# Patient Record
Sex: Female | Born: 1935 | Race: White | Hispanic: No | Marital: Single | Smoking: Former smoker
Health system: Southern US, Community
[De-identification: ages and names within clinical notes are randomized; demographics above are authoritative.]

## PROBLEM LIST (undated history)

## (undated) DIAGNOSIS — R51 Headache: Secondary | ICD-10-CM

## (undated) DIAGNOSIS — R011 Cardiac murmur, unspecified: Secondary | ICD-10-CM

## (undated) DIAGNOSIS — R600 Localized edema: Secondary | ICD-10-CM

## (undated) DIAGNOSIS — Z8719 Personal history of other diseases of the digestive system: Secondary | ICD-10-CM

## (undated) DIAGNOSIS — J302 Other seasonal allergic rhinitis: Secondary | ICD-10-CM

## (undated) DIAGNOSIS — F32A Depression, unspecified: Secondary | ICD-10-CM

## (undated) DIAGNOSIS — I10 Essential (primary) hypertension: Secondary | ICD-10-CM

## (undated) DIAGNOSIS — Z9289 Personal history of other medical treatment: Secondary | ICD-10-CM

## (undated) DIAGNOSIS — I350 Nonrheumatic aortic (valve) stenosis: Secondary | ICD-10-CM

## (undated) DIAGNOSIS — F329 Major depressive disorder, single episode, unspecified: Secondary | ICD-10-CM

## (undated) DIAGNOSIS — M1990 Unspecified osteoarthritis, unspecified site: Secondary | ICD-10-CM

## (undated) DIAGNOSIS — T7840XA Allergy, unspecified, initial encounter: Secondary | ICD-10-CM

## (undated) DIAGNOSIS — A159 Respiratory tuberculosis unspecified: Secondary | ICD-10-CM

## (undated) DIAGNOSIS — K219 Gastro-esophageal reflux disease without esophagitis: Secondary | ICD-10-CM

## (undated) HISTORY — PX: EYE SURGERY: SHX253

## (undated) HISTORY — DX: Allergy, unspecified, initial encounter: T78.40XA

## (undated) HISTORY — DX: Nonrheumatic aortic (valve) stenosis: I35.0

## (undated) HISTORY — PX: OTHER SURGICAL HISTORY: SHX169

## (undated) HISTORY — PX: BACK SURGERY: SHX140

---
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
## 1964-12-02 HISTORY — PX: APPENDECTOMY: SHX54

## 1969-12-02 DIAGNOSIS — A159 Respiratory tuberculosis unspecified: Secondary | ICD-10-CM

## 1969-12-02 HISTORY — DX: Respiratory tuberculosis unspecified: A15.9

## 2000-04-15 ENCOUNTER — Emergency Department (HOSPITAL_COMMUNITY): Admission: EM | Admit: 2000-04-15 | Discharge: 2000-04-15 | Payer: Self-pay | Admitting: Emergency Medicine

## 2000-09-23 ENCOUNTER — Emergency Department (HOSPITAL_COMMUNITY): Admission: EM | Admit: 2000-09-23 | Discharge: 2000-09-23 | Payer: Self-pay | Admitting: Emergency Medicine

## 2004-12-02 HISTORY — PX: ABDOMINAL HYSTERECTOMY: SHX81

## 2005-06-24 ENCOUNTER — Other Ambulatory Visit: Admission: RE | Admit: 2005-06-24 | Discharge: 2005-06-24 | Payer: Self-pay | Admitting: Family Medicine

## 2005-06-28 ENCOUNTER — Encounter: Admission: RE | Admit: 2005-06-28 | Discharge: 2005-06-28 | Payer: Self-pay | Admitting: Family Medicine

## 2005-11-14 ENCOUNTER — Encounter (INDEPENDENT_AMBULATORY_CARE_PROVIDER_SITE_OTHER): Payer: Self-pay | Admitting: Specialist

## 2005-11-14 ENCOUNTER — Observation Stay (HOSPITAL_COMMUNITY): Admission: RE | Admit: 2005-11-14 | Discharge: 2005-11-15 | Payer: Self-pay | Admitting: Obstetrics and Gynecology

## 2006-08-15 ENCOUNTER — Encounter (INDEPENDENT_AMBULATORY_CARE_PROVIDER_SITE_OTHER): Payer: Self-pay | Admitting: *Deleted

## 2006-08-15 ENCOUNTER — Ambulatory Visit (HOSPITAL_COMMUNITY): Admission: RE | Admit: 2006-08-15 | Discharge: 2006-08-16 | Payer: Self-pay | Admitting: Otolaryngology

## 2006-09-03 ENCOUNTER — Ambulatory Visit (HOSPITAL_COMMUNITY): Admission: RE | Admit: 2006-09-03 | Discharge: 2006-09-03 | Payer: Self-pay | Admitting: Obstetrics and Gynecology

## 2006-12-02 HISTORY — PX: OTHER SURGICAL HISTORY: SHX169

## 2008-12-22 ENCOUNTER — Ambulatory Visit (HOSPITAL_COMMUNITY): Admission: RE | Admit: 2008-12-22 | Discharge: 2008-12-22 | Payer: Self-pay | Admitting: Obstetrics and Gynecology

## 2009-04-10 ENCOUNTER — Encounter: Admission: RE | Admit: 2009-04-10 | Discharge: 2009-04-10 | Payer: Self-pay | Admitting: Family Medicine

## 2010-01-04 ENCOUNTER — Ambulatory Visit (HOSPITAL_COMMUNITY): Admission: RE | Admit: 2010-01-04 | Discharge: 2010-01-04 | Payer: Self-pay | Admitting: Obstetrics and Gynecology

## 2010-01-11 ENCOUNTER — Encounter: Admission: RE | Admit: 2010-01-11 | Discharge: 2010-01-11 | Payer: Self-pay | Admitting: Obstetrics and Gynecology

## 2010-07-31 ENCOUNTER — Encounter: Admission: RE | Admit: 2010-07-31 | Discharge: 2010-07-31 | Payer: Self-pay | Admitting: Obstetrics and Gynecology

## 2010-12-23 ENCOUNTER — Encounter: Payer: Self-pay | Admitting: Obstetrics and Gynecology

## 2011-04-16 ENCOUNTER — Other Ambulatory Visit: Payer: Self-pay | Admitting: Neurosurgery

## 2011-04-16 DIAGNOSIS — M545 Low back pain, unspecified: Secondary | ICD-10-CM

## 2011-04-19 NOTE — Op Note (Signed)
NAME:  Amanda Ponce, Amanda Ponce                ACCOUNT NO.:  [id number]   MEDICAL RECORD NO.:  [id number]          PATIENT TYPE:  AMB   LOCATION:  SDS                          FACILITY:  [facility]   PHYSICIAN:  Jefry H. Pollyann Kennedy, MD     DATE OF BIRTH:  [birth year]   DATE OF PROCEDURE:  08/15/2006  DATE OF DISCHARGE:                                 OPERATIVE REPORT   PREOPERATIVE DIAGNOSIS:  Left thyroid mass.   POSTOPERATIVE DIAGNOSIS:  Left thyroid mass.   PROCEDURE:  Left hemithyroidectomy.   SURGEON:  Jefry H. Pollyann Kennedy, M.D.   ASSISTANT:  Suzanna Obey, M.D.   ANESTHESIA:  General endotracheal anesthesia was used.   COMPLICATIONS:  No complications.   BLOOD LOSS:  40 cc.   FINDINGS:  Large multilobulated thyroid mass on the left, consistent with  goiter grossly. 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  There was a solitary small firm nodule on the right side, but the  remainder of the gland was unremarkable.  There were no palpable nodes in  the jugular chain.   REFERRING PHYSICIAN:  Devoria Albe, M.D.   HISTORY:  This is a 75 year old lady who has a long history of a large  thyroid mass on the left side that has been bothersome to her and she had  requested having the mass removed.  Risks, benefits, alternatives, and  complications of the procedure were explained to the patient, who seemed to  understand and agreed to the surgery.   PROCEDURE:  The patient was taken to the operating room and placed on the  operating table in a supine position.  Following induction of general  endotracheal anesthesia, the neck was prepped and draped in standard  fashion.  A low collar incision was outlined with a marking pen within a  skin crease, and electrocautery was used to incise the skin, subcutaneous  tissue, and through the platysma layer.  Subplatysmal flap was developed  superiorly to the thyroid notch and inferiorly to the sternal notch.  A self-  retaining thyroid retractor  was used.  The midline fascia was divided, as  was the diastasis of the strap muscles.  The straps were reflected laterally  on the left, exposing the large thyroid mass.  The trachea was deviated to  the right side substantially.  Careful dissection around the capsule of the  glans then continued around the entire mass.  Silk ties were used through  the ligate vessels.  The superior laryngeal nerve was identified and  preserved.  The recurrent laryngeal nerve was not separately identified.  It  was felt to be within soft tissue adjacent to the esophagus.  There was  concern about possible injury to the nerve, although I cannot be sure based  on the dissection, and there were multiple vessels along the course of where  the nerve was expected to be that were identified as vessels.  Parathyroids  were not separately identified.  The isthmus was divided.  The gland was  sent for pathologic evaluation with frozen section and was consistent with  follicular neoplasm.  The wound was irrigated with saline, and hemostasis  was completed using a bipolar cautery and additional silk ligatures.  The 10-  [place] round JP drain was left in the wound, exited through the right side  incision, and secured in place with nylon suture.  Chromic suture was used  to reapproximate the midline fascia and the platysma layer.  Running 5-0  nylon was used on the skin.  The patient was awakened, extubated, and  transferred to recovery.      Jefry H. Pollyann Kennedy, MD  Electronically Signed     JHR/MEDQ  D:  08/15/2006  T:  08/16/2006  Job:  [id number]   cc:   Devoria Albe, M.D.

## 2011-04-19 NOTE — Op Note (Signed)
NAME:  Amanda Ponce, Amanda Ponce                ACCOUNT NO.:  [id number]   MEDICAL RECORD NO.:  [id number]          PATIENT TYPE:  OBV   LOCATION:  9302                          FACILITY:  [facility]   PHYSICIAN:  Sherry A. Dickstein, M.D.DATE OF BIRTH:  13-Oct-1936   DATE OF PROCEDURE:  11/14/2005  DATE OF DISCHARGE:                                 OPERATIVE REPORT   PREOPERATIVE DIAGNOSIS:  CIN III.   POSTOPERATIVE DIAGNOSIS:  CIN III.   PROCEDURE:  LAVH, BSO.   SURGEON:  Sherry A. Rosalio Macadamia, M.D. and Maxie Better, M.D.   ANESTHESIA:  General   INDICATIONS:  This is a 75 year old gravida 4, para 4-0-0-4 woman who had an  abnormal Pap smear for which she underwent colposcopy. Colposcopic biopsies  revealed CIN III. Because of the CIN III, she underwent a LEEP procedure.  LEEP procedure revealed CIN III to the edges of the specimen. The patient is  brought back and repeat LEEP procedure was performed which again showed CIN  III to the edges of the LEEP specimen plus some abnormal tissue in the  endocervical specimen. Because of the persistent nature of the CIN III  however, without any true carcinoma present, the patient was advised that  she needs to undergo hysterectomy. Therefore, the patient is brought to the  operating room for LAVH BSO.   FINDINGS:  Small anteflexed uterus, normal fallopian tubes and ovaries.   PROCEDURE:  The patient was brought into the operating room and given  adequate general anesthesia. She was placed in dorsal lithotomy position.  Her abdomen and vagina were washed with Betadine and Foley catheter was  inserted into the bladder. Speculum was placed within the vagina. Cervix was  grasped with single-tooth Hulka tenaculum. Surgeon's gown and gloves were  changed. The patient was draped in sterile fashion. Subumbilical incision  was made after infiltrating with 0.25% Marcaine. This incision was brought  down to the fascia. Fascia was grasped with Kocher clamps.  Fascia was  incised sharply. A 0 Vicryl suture was placed in a pursestring stitch.  Hasson trocar was introduced into the peritoneal cavity. Peritoneum was  insufflated with carbon dioxide. The pelvis was inspected. A left midline  incision was made after infiltrating with 0.25% Marcaine and under direct  visualization, 5 mm trocar was introduced. Same procedure was performed on  the right. The right round ligament was then grasped and cauterized with  tripolar cautery and cut. The right infundibulopelvic ligament was  identified. The right ureter was easily identified and the IP was cauterized  x3 and cut. The cautery was taken up underneath the ovary over to the  uterus. There was some adhesions of the bladder flap to the lower uterine  segment. Some of these adhesions were dissected free. The bladder was hydro  dissected off of the cervix with the [place]. The left round ligament was  cauterized with the tripolar and cut. It was difficult to see the left  ureter because of the anatomy with the amount of fat that was in the way.  The left utero-ovarian ligaments were cauterized with tripolar x3  and cut.  This was brought down to the round ligament. Then the IP ligaments were  cauterized and cut to meet up with the left round ligament. It was felt that  this was done in a safe fashion by elevating the IP well above the ureter.  The left tube and ovary was then released freely and put in the cul-de-sac  to be attempted to be removed at the time of the removal of the uterus. The  bladder flap was going to be dissected however it was not readily free and  decision was made to open it from the vaginal approach. Therefore the  vaginal part of the procedure was then performed.   The patient was put in a steep dorsal lithotomy position. Weighted retractor  was placed in the vagina. The cervix was grasped with [place] tenaculums.  The cervix was infiltrated with 1% Xylocaine with 1:100,000  epinephrine. The  cervix was circumcised to try to assure that no cervical tissue was allowed  to remain. The posterior vaginal cuff was dissected. The posterior cul-de-  sac was identified. It was opened sharply. Fluid was identified. The free  left tube and ovary could not be identified. The posterior cul-de-sac was  closed using a 0 Vicryl running locked whipped stitch to close the  peritoneum to the vagina. Long nose weighted speculum was placed within the  space. Uterosacral ligaments were clamped, cut and suture ligated. The  anterior vaginal mucosa was dissected off of the cervix and the anterior cul-  de-sac was entered using LigaSure. The cardinal ligaments were clamped,  cauterized and cut on alternating sides. The uterine arteries were clamped,  cut and cauterized x3 for adequate hemostasis and once all ligaments were  cauterized and cut. The cervix and uterus and right tube and ovary were  removed through the vagina. There was some bleeding from the left cardinal  ligament. This was stopped with 0 Vicryl in figure-of-eight stitches. The  long nosed weighted speculum was removed. The posterior vaginal cuff was  closed again using 0 Vicryl in running locked stitch for adequate  hemostasis. All pedicles were reinspected and felt to have adequate  hemostasis. The vaginal cuff was then closed using 0 Vicryl in figure-of-  eight stitches. The vagina was packed with 1-inch plain packing with Estrace  cream. The patient is taken out of the steep dorsal lithotomy position and  surgeon's gloves were changed. The abdomen was then reinsufflated with  carbon dioxide and the laparoscope was replaced. The left tube and ovary was  easily visualized. This was removed through the left scope without any  difficulty. The small amount of bleeding under the bladder flap. This was  free cauterized. Pictures were obtained. Adequate hemostasis was present. The pelvis was irrigated with large amounts of  Ringer's lactate. Adequate  hemostasis was still present. Approximately 20 mL of 0.25% Marcaine were  left in the pelvis along the cul-de-sac. All carbon dioxide was allowed to  escape. All instruments were removed. The incisions were closed using 4-0  Monocryl in subcuticular interrupted stitches. Dermabond was placed over the  three incisions. The patient was then taken out of the dorsal lithotomy  position. She was awakened. She was moved from the operating table to a  stretcher in stable condition. Complications were none.   SPECIMEN:  Uterus, cervix, tubes and ovaries bilaterally.      Sherry A. Rosalio Macadamia, M.D.  Electronically Signed     SAD/MEDQ  D:  11/14/2005  T:  11/14/2005  Job:  [id number]

## 2011-04-30 ENCOUNTER — Ambulatory Visit
Admission: RE | Admit: 2011-04-30 | Discharge: 2011-04-30 | Disposition: A | Discharge status: Home / Self Care | Payer: 59 | Source: Ambulatory Visit | Attending: Neurosurgery | Admitting: Neurosurgery

## 2011-04-30 DIAGNOSIS — M545 Low back pain, unspecified: Secondary | ICD-10-CM

## 2012-04-07 ENCOUNTER — Other Ambulatory Visit: Payer: Self-pay | Admitting: Family Medicine

## 2012-04-07 DIAGNOSIS — I10 Essential (primary) hypertension: Secondary | ICD-10-CM | POA: Insufficient documentation

## 2012-04-07 DIAGNOSIS — N63 Unspecified lump in unspecified breast: Secondary | ICD-10-CM

## 2012-04-07 DIAGNOSIS — M545 Low back pain, unspecified: Secondary | ICD-10-CM | POA: Insufficient documentation

## 2012-04-16 ENCOUNTER — Other Ambulatory Visit: Payer: Self-pay | Admitting: Gastroenterology

## 2012-04-21 ENCOUNTER — Ambulatory Visit
Admission: RE | Admit: 2012-04-21 | Discharge: 2012-04-21 | Disposition: A | Discharge status: Home / Self Care | Payer: 59 | Source: Ambulatory Visit | Attending: Gastroenterology | Admitting: Gastroenterology

## 2012-04-22 ENCOUNTER — Ambulatory Visit
Admission: RE | Admit: 2012-04-22 | Discharge: 2012-04-22 | Disposition: A | Discharge status: Home / Self Care | Payer: PRIVATE HEALTH INSURANCE | Source: Ambulatory Visit | Attending: Family Medicine | Admitting: Family Medicine

## 2012-04-22 DIAGNOSIS — N63 Unspecified lump in unspecified breast: Secondary | ICD-10-CM

## 2012-06-29 DIAGNOSIS — K219 Gastro-esophageal reflux disease without esophagitis: Secondary | ICD-10-CM | POA: Insufficient documentation

## 2012-07-07 ENCOUNTER — Other Ambulatory Visit: Payer: Self-pay | Admitting: Neurosurgery

## 2012-07-21 DIAGNOSIS — E785 Hyperlipidemia, unspecified: Secondary | ICD-10-CM | POA: Insufficient documentation

## 2012-08-10 ENCOUNTER — Encounter (HOSPITAL_COMMUNITY)
Admission: RE | Admit: 2012-08-10 | Discharge: 2012-08-10 | Disposition: A | Discharge status: Home / Self Care | Payer: PRIVATE HEALTH INSURANCE | Source: Ambulatory Visit | Attending: Neurosurgery | Admitting: Neurosurgery

## 2012-08-10 ENCOUNTER — Encounter (HOSPITAL_COMMUNITY): Payer: Self-pay

## 2012-08-10 ENCOUNTER — Ambulatory Visit (HOSPITAL_COMMUNITY)
Admission: RE | Admit: 2012-08-10 | Discharge: 2012-08-10 | Disposition: A | Discharge status: Home / Self Care | Payer: PRIVATE HEALTH INSURANCE | Source: Ambulatory Visit | Attending: Neurosurgery | Admitting: Neurosurgery

## 2012-08-10 ENCOUNTER — Encounter (HOSPITAL_COMMUNITY): Payer: Self-pay | Admitting: Pharmacy Technician

## 2012-08-10 DIAGNOSIS — Z01810 Encounter for preprocedural cardiovascular examination: Secondary | ICD-10-CM | POA: Insufficient documentation

## 2012-08-10 DIAGNOSIS — Z01812 Encounter for preprocedural laboratory examination: Secondary | ICD-10-CM | POA: Insufficient documentation

## 2012-08-10 DIAGNOSIS — Z01818 Encounter for other preprocedural examination: Secondary | ICD-10-CM | POA: Insufficient documentation

## 2012-08-10 HISTORY — DX: Essential (primary) hypertension: I10

## 2012-08-10 HISTORY — DX: Headache: R51

## 2012-08-10 HISTORY — DX: Gastro-esophageal reflux disease without esophagitis: K21.9

## 2012-08-10 HISTORY — DX: Other seasonal allergic rhinitis: J30.2

## 2012-08-10 LAB — CBC
HCT: 40.7 % (ref 36.0–46.0)
Hemoglobin: 14.1 g/dL (ref 12.0–15.0)
MCH: 32 pg (ref 26.0–34.0)
MCHC: 34.6 g/dL (ref 30.0–36.0)
MCV: 92.3 fL (ref 78.0–100.0)
Platelets: 248 10*3/uL (ref 150–400)
RBC: 4.41 MIL/uL (ref 3.87–5.11)
RDW: 12.3 % (ref 11.5–15.5)
WBC: 8.1 10*3/uL (ref 4.0–10.5)

## 2012-08-10 LAB — BASIC METABOLIC PANEL WITH GFR
BUN: 9 mg/dL (ref 6–23)
CO2: 31 meq/L (ref 19–32)
Calcium: 9.7 mg/dL (ref 8.4–10.5)
Chloride: 97 meq/L (ref 96–112)
Creatinine, Ser: 0.73 mg/dL (ref 0.50–1.10)
GFR calc Af Amer: >90 mL/min
GFR calc non Af Amer: 82 mL/min — ABNORMAL LOW
Glucose, Bld: 94 mg/dL (ref 70–99)
Potassium: 3.8 meq/L (ref 3.5–5.1)
Sodium: 137 meq/L (ref 135–145)

## 2012-08-10 LAB — SURGICAL PCR SCREEN
MRSA, PCR: NEGATIVE
Staphylococcus aureus: NEGATIVE

## 2012-08-10 NOTE — Pre-Procedure Instructions (Addendum)
20 Amanda Ponce  08/10/2012   Your procedure is scheduled on: Wednesday, September 18th.  Report to [facility] at 6:30 AM.  Call this number if you have problems the morning of surgery: [phone]   Remember:   Do not eat food or anything to drink:After Midnight.     Take these medicines the morning of surgery with A SIP OF WATER: -Dexilant, Bupropion.  May use Flovent. May take Tramadol if needed.  Stop taking any Aspirin, NSAIDS, Coumadin, Plavix and Herbal Medications.   Do not wear jewelry, make-up or nail polish.  Do not wear lotions, powders, or perfumes. You may wear deodorant.  Do not shave 48 hours prior to surgery. Men may shave face and neck.  Do not bring valuables to the hospital.  Contacts, dentures or bridgework may not be worn into surgery.  Leave suitcase in the car. After surgery it may be brought to your room.  For patients admitted to the hospital, checkout time is 11:00 AM the day of discharge.   Patients discharged the day of surgery will not be allowed to drive home.  Name and phone number of your driver: ___________   Special Instructions: CHG Shower Use Special Wash: 1/2 bottle night before surgery and 1/2 bottle morning of surgery.   Please read over the following fact sheets that you were given: Pain Booklet, Coughing and Deep Breathing and Surgical Site Infection Prevention

## 2012-08-18 MED ORDER — CEFAZOLIN SODIUM-DEXTROSE 2-3 GM-% IV SOLR
2.0000 g | INTRAVENOUS | Status: DC
  Filled 2012-08-18: qty 50

## 2012-08-19 ENCOUNTER — Encounter (HOSPITAL_COMMUNITY): Payer: Self-pay | Admitting: *Deleted

## 2012-08-19 ENCOUNTER — Encounter (HOSPITAL_COMMUNITY): Payer: Self-pay | Admitting: Certified Registered"

## 2012-08-19 ENCOUNTER — Encounter (HOSPITAL_COMMUNITY)
Admission: RE | Disposition: A | Discharge status: Home / Self Care | Payer: Self-pay | Source: Ambulatory Visit | Attending: Neurosurgery

## 2012-08-19 ENCOUNTER — Ambulatory Visit (HOSPITAL_COMMUNITY): Payer: PRIVATE HEALTH INSURANCE | Admitting: Certified Registered"

## 2012-08-19 ENCOUNTER — Ambulatory Visit (HOSPITAL_COMMUNITY): Payer: PRIVATE HEALTH INSURANCE

## 2012-08-19 ENCOUNTER — Inpatient Hospital Stay (HOSPITAL_COMMUNITY)
Admission: RE | Admit: 2012-08-19 | Discharge: 2012-08-20 | DRG: 491 | Disposition: A | Discharge status: Home / Self Care | Payer: PRIVATE HEALTH INSURANCE | Source: Ambulatory Visit | Attending: Neurosurgery | Admitting: Neurosurgery

## 2012-08-19 DIAGNOSIS — M48062 Spinal stenosis, lumbar region with neurogenic claudication: Principal | ICD-10-CM | POA: Diagnosis present

## 2012-08-19 DIAGNOSIS — M47817 Spondylosis without myelopathy or radiculopathy, lumbosacral region: Secondary | ICD-10-CM | POA: Diagnosis present

## 2012-08-19 DIAGNOSIS — M5137 Other intervertebral disc degeneration, lumbosacral region: Secondary | ICD-10-CM | POA: Diagnosis present

## 2012-08-19 DIAGNOSIS — K219 Gastro-esophageal reflux disease without esophagitis: Secondary | ICD-10-CM | POA: Diagnosis present

## 2012-08-19 DIAGNOSIS — Z79899 Other long term (current) drug therapy: Secondary | ICD-10-CM

## 2012-08-19 DIAGNOSIS — I10 Essential (primary) hypertension: Secondary | ICD-10-CM | POA: Diagnosis present

## 2012-08-19 DIAGNOSIS — M51379 Other intervertebral disc degeneration, lumbosacral region without mention of lumbar back pain or lower extremity pain: Secondary | ICD-10-CM | POA: Diagnosis present

## 2012-08-19 HISTORY — PX: LUMBAR LAMINECTOMY/DECOMPRESSION MICRODISCECTOMY: SHX5026

## 2012-08-19 SURGERY — LUMBAR LAMINECTOMY/DECOMPRESSION MICRODISCECTOMY 3 LEVELS
Anesthesia: General | Site: Spine Lumbar | Wound class: Clean

## 2012-08-19 MED ORDER — HYDROMORPHONE HCL PF 1 MG/ML IJ SOLN
INTRAMUSCULAR | Status: AC
  Administered 2012-08-19: 0.5 mg via INTRAVENOUS
  Filled 2012-08-19: qty 1

## 2012-08-19 MED ORDER — LACTATED RINGERS IV SOLN
INTRAVENOUS | Status: DC | PRN
  Administered 2012-08-19 (×2): via INTRAVENOUS

## 2012-08-19 MED ORDER — HEMOSTATIC AGENTS (NO CHARGE) OPTIME
TOPICAL | Status: DC | PRN
  Administered 2012-08-19: 1 via TOPICAL

## 2012-08-19 MED ORDER — ACETAMINOPHEN 10 MG/ML IV SOLN
INTRAVENOUS | Status: AC
  Filled 2012-08-19: qty 100

## 2012-08-19 MED ORDER — FENTANYL CITRATE 0.05 MG/ML IJ SOLN
INTRAMUSCULAR | Status: DC | PRN
  Administered 2012-08-19: 50 ug via INTRAVENOUS
  Administered 2012-08-19: 100 ug via INTRAVENOUS
  Administered 2012-08-19: 50 ug via INTRAVENOUS
  Administered 2012-08-19: 25 ug via INTRAVENOUS
  Administered 2012-08-19 (×2): 50 ug via INTRAVENOUS

## 2012-08-19 MED ORDER — ALUM & MAG HYDROXIDE-SIMETH 200-200-20 MG/5ML PO SUSP: 30.0000 mL | Freq: Four times a day (QID) | ORAL | Status: DC | PRN

## 2012-08-19 MED ORDER — BISACODYL 10 MG RE SUPP: 10.0000 mg | Freq: Every day | RECTAL | Status: DC | PRN

## 2012-08-19 MED ORDER — GLYCOPYRROLATE 0.2 MG/ML IJ SOLN
INTRAMUSCULAR | Status: DC | PRN
  Administered 2012-08-19: 0.4 mg via INTRAVENOUS

## 2012-08-19 MED ORDER — SODIUM CHLORIDE 0.9 % IJ SOLN
3.0000 mL | Freq: Two times a day (BID) | INTRAMUSCULAR | Status: DC
  Administered 2012-08-19 – 2012-08-20 (×2): 3 mL via INTRAVENOUS

## 2012-08-19 MED ORDER — BUPIVACAINE HCL (PF) 0.5 % IJ SOLN
INTRAMUSCULAR | Status: DC | PRN
  Administered 2012-08-19: 15 mL

## 2012-08-19 MED ORDER — MAGNESIUM HYDROXIDE 400 MG/5ML PO SUSP: 30.0000 mL | Freq: Every day | ORAL | Status: DC | PRN

## 2012-08-19 MED ORDER — NEOSTIGMINE METHYLSULFATE 1 MG/ML IJ SOLN
INTRAMUSCULAR | Status: DC | PRN
  Administered 2012-08-19: 3 mg via INTRAVENOUS

## 2012-08-19 MED ORDER — PROPOFOL 10 MG/ML IV BOLUS
INTRAVENOUS | Status: DC | PRN
  Administered 2012-08-19: 150 mg via INTRAVENOUS

## 2012-08-19 MED ORDER — KETOROLAC TROMETHAMINE 30 MG/ML IJ SOLN
15.0000 mg | Freq: Once | INTRAMUSCULAR | Status: AC
  Administered 2012-08-19: 15 mg via INTRAVENOUS

## 2012-08-19 MED ORDER — OXYCODONE HCL 5 MG PO TABS
5.0000 mg | ORAL_TABLET | ORAL | Status: DC | PRN
  Administered 2012-08-19 – 2012-08-20 (×2): 5 mg via ORAL
  Filled 2012-08-19 (×2): qty 1

## 2012-08-19 MED ORDER — HYDROCODONE-ACETAMINOPHEN 5-325 MG PO TABS: 1.0000 | ORAL_TABLET | ORAL | Status: DC | PRN

## 2012-08-19 MED ORDER — ZOLPIDEM TARTRATE 5 MG PO TABS: 5.0000 mg | ORAL_TABLET | Freq: Every evening | ORAL | Status: DC | PRN

## 2012-08-19 MED ORDER — HYDROXYZINE HCL 50 MG/ML IM SOLN: 50.0000 mg | INTRAMUSCULAR | Status: DC | PRN

## 2012-08-19 MED ORDER — MENTHOL 3 MG MT LOZG: 1.0000 | LOZENGE | OROMUCOSAL | Status: DC | PRN

## 2012-08-19 MED ORDER — CYCLOBENZAPRINE HCL 10 MG PO TABS
10.0000 mg | ORAL_TABLET | Freq: Three times a day (TID) | ORAL | Status: DC | PRN
  Administered 2012-08-19 – 2012-08-20 (×2): 10 mg via ORAL
  Filled 2012-08-19 (×2): qty 1

## 2012-08-19 MED ORDER — BUPROPION HCL ER (XL) 300 MG PO TB24
300.0000 mg | ORAL_TABLET | Freq: Every morning | ORAL | Status: DC
  Filled 2012-08-19 (×2): qty 1

## 2012-08-19 MED ORDER — VECURONIUM BROMIDE 10 MG IV SOLR
INTRAVENOUS | Status: DC | PRN
  Administered 2012-08-19 (×3): 2 mg via INTRAVENOUS

## 2012-08-19 MED ORDER — KETOROLAC TROMETHAMINE 30 MG/ML IJ SOLN
15.0000 mg | Freq: Four times a day (QID) | INTRAMUSCULAR | Status: DC
  Administered 2012-08-19 – 2012-08-20 (×3): 15 mg via INTRAVENOUS
  Filled 2012-08-19 (×7): qty 1

## 2012-08-19 MED ORDER — ACETAMINOPHEN 325 MG PO TABS: 650.0000 mg | ORAL_TABLET | ORAL | Status: DC | PRN

## 2012-08-19 MED ORDER — THROMBIN 20000 UNITS EX SOLR
CUTANEOUS | Status: DC | PRN
  Administered 2012-08-19: 10:00:00 via TOPICAL

## 2012-08-19 MED ORDER — HYDROMORPHONE HCL PF 1 MG/ML IJ SOLN
0.2500 mg | INTRAMUSCULAR | Status: DC | PRN
  Administered 2012-08-19 (×2): 0.5 mg via INTRAVENOUS

## 2012-08-19 MED ORDER — OXYCODONE-ACETAMINOPHEN 5-325 MG PO TABS: 1.0000 | ORAL_TABLET | ORAL | Status: DC | PRN

## 2012-08-19 MED ORDER — ALBUTEROL SULFATE HFA 108 (90 BASE) MCG/ACT IN AERS
INHALATION_SPRAY | RESPIRATORY_TRACT | Status: DC | PRN
  Administered 2012-08-19 (×2): 2 via RESPIRATORY_TRACT

## 2012-08-19 MED ORDER — ACETAMINOPHEN 650 MG RE SUPP: 650.0000 mg | RECTAL | Status: DC | PRN

## 2012-08-19 MED ORDER — LIDOCAINE-EPINEPHRINE 1 %-1:100000 IJ SOLN
INTRAMUSCULAR | Status: DC | PRN
  Administered 2012-08-19: 15 mL

## 2012-08-19 MED ORDER — SODIUM CHLORIDE 0.9 % IV SOLN
INTRAVENOUS | Status: AC
  Filled 2012-08-19: qty 500

## 2012-08-19 MED ORDER — BACITRACIN 50000 UNITS IM SOLR
INTRAMUSCULAR | Status: AC
  Filled 2012-08-19: qty 1

## 2012-08-19 MED ORDER — HYDROCHLOROTHIAZIDE 12.5 MG PO CAPS
12.5000 mg | ORAL_CAPSULE | Freq: Every morning | ORAL | Status: DC
  Administered 2012-08-19 – 2012-08-20 (×2): 12.5 mg via ORAL
  Filled 2012-08-19 (×2): qty 1

## 2012-08-19 MED ORDER — MIDAZOLAM HCL 5 MG/5ML IJ SOLN
INTRAMUSCULAR | Status: DC | PRN
  Administered 2012-08-19: 1 mg via INTRAVENOUS

## 2012-08-19 MED ORDER — ACETAMINOPHEN 10 MG/ML IV SOLN
1000.0000 mg | Freq: Four times a day (QID) | INTRAVENOUS | Status: DC
  Administered 2012-08-19 – 2012-08-20 (×3): 1000 mg via INTRAVENOUS
  Filled 2012-08-19 (×4): qty 100

## 2012-08-19 MED ORDER — MORPHINE SULFATE 4 MG/ML IJ SOLN: 4.0000 mg | INTRAMUSCULAR | Status: DC | PRN

## 2012-08-19 MED ORDER — ONDANSETRON HCL 4 MG/2ML IJ SOLN
INTRAMUSCULAR | Status: DC | PRN
  Administered 2012-08-19: 4 mg via INTRAVENOUS

## 2012-08-19 MED ORDER — LIDOCAINE HCL (CARDIAC) 20 MG/ML IV SOLN
INTRAVENOUS | Status: DC | PRN
  Administered 2012-08-19: 40 mg via INTRAVENOUS

## 2012-08-19 MED ORDER — PHENOL 1.4 % MT LIQD: 1.0000 | OROMUCOSAL | Status: DC | PRN

## 2012-08-19 MED ORDER — KCL IN DEXTROSE-NACL 20-5-0.45 MEQ/L-%-% IV SOLN
INTRAVENOUS | Status: DC
  Administered 2012-08-19: 13:00:00 via INTRAVENOUS
  Filled 2012-08-19 (×5): qty 1000

## 2012-08-19 MED ORDER — ACETAMINOPHEN 10 MG/ML IV SOLN
INTRAVENOUS | Status: DC | PRN
  Administered 2012-08-19: 1000 mg via INTRAVENOUS

## 2012-08-19 MED ORDER — SODIUM CHLORIDE 0.9 % IR SOLN
Status: DC | PRN
  Administered 2012-08-19: 09:00:00

## 2012-08-19 MED ORDER — HYDROXYZINE HCL 25 MG PO TABS: 50.0000 mg | ORAL_TABLET | ORAL | Status: DC | PRN

## 2012-08-19 MED ORDER — PANTOPRAZOLE SODIUM 40 MG PO TBEC: 40.0000 mg | DELAYED_RELEASE_TABLET | Freq: Every day | ORAL | Status: DC

## 2012-08-19 MED ORDER — ROCURONIUM BROMIDE 100 MG/10ML IV SOLN
INTRAVENOUS | Status: DC | PRN
  Administered 2012-08-19: 50 mg via INTRAVENOUS

## 2012-08-19 MED ORDER — FLUTICASONE PROPIONATE HFA 110 MCG/ACT IN AERO
2.0000 | INHALATION_SPRAY | Freq: Two times a day (BID) | RESPIRATORY_TRACT | Status: DC
  Administered 2012-08-19: 2 via RESPIRATORY_TRACT
  Filled 2012-08-19: qty 12

## 2012-08-19 MED ORDER — KETOROLAC TROMETHAMINE 30 MG/ML IJ SOLN
INTRAMUSCULAR | Status: AC
  Filled 2012-08-19: qty 1

## 2012-08-19 MED ORDER — SODIUM CHLORIDE 0.9 % IJ SOLN: 3.0000 mL | INTRAMUSCULAR | Status: DC | PRN

## 2012-08-19 MED ORDER — THROMBIN 5000 UNITS EX KIT
PACK | CUTANEOUS | Status: DC | PRN
  Administered 2012-08-19: 5000 [IU] via TOPICAL

## 2012-08-19 MED ORDER — 0.9 % SODIUM CHLORIDE (POUR BTL) OPTIME
TOPICAL | Status: DC | PRN
  Administered 2012-08-19 (×2): 1000 mL

## 2012-08-19 SURGICAL SUPPLY — 61 items
BAG DECANTER FOR FLEXI CONT (MISCELLANEOUS) ×2 IMPLANT
BENZOIN TINCTURE PRP APPL 2/3 (GAUZE/BANDAGES/DRESSINGS) IMPLANT
BLADE SURG ROTATE 9660 (MISCELLANEOUS) IMPLANT
BRUSH SCRUB EZ PLAIN DRY (MISCELLANEOUS) ×2 IMPLANT
BUR ACORN 6.0 ACORN (BURR) ×2 IMPLANT
BUR ACRON 5.0MM COATED (BURR) ×2 IMPLANT
BUR MATCHSTICK NEURO 3.0 LAGG (BURR) ×2 IMPLANT
CANISTER SUCTION 2500CC (MISCELLANEOUS) ×2 IMPLANT
CATH ROBINSON RED A/P 14FR (CATHETERS) ×2 IMPLANT
CLOTH BEACON ORANGE TIMEOUT ST (SAFETY) ×2 IMPLANT
CONT SPEC 4OZ CLIKSEAL STRL BL (MISCELLANEOUS) IMPLANT
DERMABOND ADHESIVE PROPEN (GAUZE/BANDAGES/DRESSINGS) ×2
DERMABOND ADVANCED (GAUZE/BANDAGES/DRESSINGS)
DERMABOND ADVANCED .7 DNX12 (GAUZE/BANDAGES/DRESSINGS) IMPLANT
DERMABOND ADVANCED .7 DNX6 (GAUZE/BANDAGES/DRESSINGS) ×2 IMPLANT
DRAPE LAPAROTOMY 100X72X124 (DRAPES) ×2 IMPLANT
DRAPE MICROSCOPE LEICA (MISCELLANEOUS) ×2 IMPLANT
DRAPE POUCH INSTRU U-SHP 10X18 (DRAPES) ×2 IMPLANT
DRSG EMULSION OIL 3X3 NADH (GAUZE/BANDAGES/DRESSINGS) IMPLANT
ELECT REM PT RETURN 9FT ADLT (ELECTROSURGICAL) ×2
ELECTRODE REM PT RTRN 9FT ADLT (ELECTROSURGICAL) ×1 IMPLANT
GAUZE SPONGE 4X4 16PLY XRAY LF (GAUZE/BANDAGES/DRESSINGS) ×2 IMPLANT
GLOVE BIO SURGEON STRL SZ8 (GLOVE) ×2 IMPLANT
GLOVE BIOGEL PI IND STRL 7.0 (GLOVE) ×1 IMPLANT
GLOVE BIOGEL PI INDICATOR 7.0 (GLOVE) ×1
GLOVE ECLIPSE 7.5 STRL STRAW (GLOVE) ×2 IMPLANT
GLOVE EXAM NITRILE LRG STRL (GLOVE) IMPLANT
GLOVE EXAM NITRILE MD LF STRL (GLOVE) ×4 IMPLANT
GLOVE EXAM NITRILE XL STR (GLOVE) IMPLANT
GLOVE EXAM NITRILE XS STR PU (GLOVE) IMPLANT
GLOVE SURG SS PI 6.5 STRL IVOR (GLOVE) ×4 IMPLANT
GOWN BRE IMP SLV AUR LG STRL (GOWN DISPOSABLE) ×2 IMPLANT
GOWN BRE IMP SLV AUR XL STRL (GOWN DISPOSABLE) ×4 IMPLANT
GOWN STRL REIN 2XL LVL4 (GOWN DISPOSABLE) IMPLANT
KIT BASIN OR (CUSTOM PROCEDURE TRAY) ×2 IMPLANT
KIT ROOM TURNOVER OR (KITS) ×2 IMPLANT
NEEDLE HYPO 18GX1.5 BLUNT FILL (NEEDLE) IMPLANT
NEEDLE SPNL 18GX3.5 QUINCKE PK (NEEDLE) ×2 IMPLANT
NEEDLE SPNL 22GX3.5 QUINCKE BK (NEEDLE) ×2 IMPLANT
NS IRRIG 1000ML POUR BTL (IV SOLUTION) ×4 IMPLANT
PACK LAMINECTOMY NEURO (CUSTOM PROCEDURE TRAY) ×2 IMPLANT
PAD ARMBOARD 7.5X6 YLW CONV (MISCELLANEOUS) ×10 IMPLANT
PATTIES SURGICAL .5 X1 (DISPOSABLE) IMPLANT
RUBBERBAND STERILE (MISCELLANEOUS) ×4 IMPLANT
SPONGE GAUZE 4X4 12PLY (GAUZE/BANDAGES/DRESSINGS) ×2 IMPLANT
SPONGE LAP 4X18 X RAY DECT (DISPOSABLE) IMPLANT
SPONGE SURGIFOAM ABS GEL 100 (HEMOSTASIS) ×2 IMPLANT
SPONGE SURGIFOAM ABS GEL SZ50 (HEMOSTASIS) ×2 IMPLANT
STRIP CLOSURE SKIN 1/2X4 (GAUZE/BANDAGES/DRESSINGS) IMPLANT
SUT BONE WAX W31G (SUTURE) ×2 IMPLANT
SUT PROLENE 6 0 BV (SUTURE) IMPLANT
SUT VIC AB 1 CT1 18XBRD ANBCTR (SUTURE) ×3 IMPLANT
SUT VIC AB 1 CT1 8-18 (SUTURE) ×3
SUT VIC AB 2-0 CP2 18 (SUTURE) ×6 IMPLANT
SUT VIC AB 3-0 SH 8-18 (SUTURE) ×2 IMPLANT
SYR 20CC LL (SYRINGE) ×2 IMPLANT
SYR 5ML LL (SYRINGE) IMPLANT
TAPE CLOTH SURG 4X10 WHT LF (GAUZE/BANDAGES/DRESSINGS) ×2 IMPLANT
TOWEL OR 17X24 6PK STRL BLUE (TOWEL DISPOSABLE) ×2 IMPLANT
TOWEL OR 17X26 10 PK STRL BLUE (TOWEL DISPOSABLE) ×2 IMPLANT
WATER STERILE IRR 1000ML POUR (IV SOLUTION) ×2 IMPLANT

## 2012-08-19 NOTE — Plan of Care (Signed)
Problem: Consults Goal: Diagnosis - Spinal Surgery Outcome: Completed/Met Date Met:  08/19/12 Lumbar Laminectomy (Complex)

## 2012-08-19 NOTE — Progress Notes (Signed)
Filed Vitals:   08/19/12 1215 08/19/12 1230 08/19/12 1259 08/19/12 1601  BP: 107/38 103/41 131/65 123/76  Pulse: 69 70 68 67  Temp:  97.9 F (36.6 C) 97.8 F (36.6 C) 98.4 F (36.9 C)  TempSrc:   Oral Oral  Resp: 11 12 18 18   SpO2: 94% 94% 95% 94%    Patient up and ambulating in halls. Dressing clean and dry. Moving all extremities well. Voiding.  Plan: Continue progress through postoperative recovery. It has up to speak with her daughters and granddaughter regarding her condition and her recommendations for treatment and care.  Hewitt Shorts, MD 08/19/2012, 7:09 PM

## 2012-08-19 NOTE — Anesthesia Postprocedure Evaluation (Signed)
  Anesthesia Post-op Note  Patient: Amanda Ponce  Procedure(s) Performed: Procedure(s) (LRB) with comments: LUMBAR LAMINECTOMY/DECOMPRESSION MICRODISCECTOMY 3 LEVELS (N/A) - Lumbar two-three, Lumbar three-four,Lumbar four-five laminectomies  Patient Location: PACU  Anesthesia Type: General  Level of Consciousness: awake  Airway and Oxygen Therapy: Patient Spontanous Breathing  Post-op Pain: mild  Post-op Assessment: Post-op Vital signs reviewed  Post-op Vital Signs: Reviewed  Complications: No apparent anesthesia complications

## 2012-08-19 NOTE — Transfer of Care (Signed)
Immediate Anesthesia Transfer of Care Note  Patient: Amanda Ponce  Procedure(s) Performed: Procedure(s) (LRB) with comments: LUMBAR LAMINECTOMY/DECOMPRESSION MICRODISCECTOMY 3 LEVELS (N/A) - Lumbar two-three, Lumbar three-four,Lumbar four-five laminectomies  Patient Location: PACU  Anesthesia Type: General  Level of Consciousness: awake, alert  and oriented  Airway & Oxygen Therapy: Patient Spontanous Breathing and Patient connected to nasal cannula oxygen  Post-op Assessment: Report given to PACU RN and Patient moving all extremities X 4  Post vital signs: Reviewed and stable  Complications: No apparent anesthesia complications

## 2012-08-19 NOTE — Preoperative (Signed)
Beta Blockers   Reason not to administer Beta Blockers:Not Applicable 

## 2012-08-19 NOTE — OR Nursing (Signed)
In and Out cath using red robinson catheter 14 french,drained 200cc amber urine by Jeanice Lim RN.

## 2012-08-19 NOTE — Anesthesia Preprocedure Evaluation (Addendum)
Anesthesia Evaluation  Patient identified by MRN, date of birth, ID band Patient awake    Reviewed: Allergy & Precautions, H&P , NPO status , Patient's Chart, lab work & pertinent test results  Airway Mallampati: I TM Distance: >3 FB Neck ROM: Full    Dental  (+) Edentulous Upper, Edentulous Lower and Dental Advisory Given   Pulmonary neg pulmonary ROS,  breath sounds clear to auscultation        Cardiovascular hypertension, Pt. on medications Rhythm:Regular Rate:Normal     Neuro/Psych  Headaches,    GI/Hepatic Neg liver ROS, GERD-  Medicated and Controlled,  Endo/Other  negative endocrine ROS  Renal/GU negative Renal ROS     Musculoskeletal   Abdominal   Peds  Hematology negative hematology ROS (+)   Anesthesia Other Findings   Reproductive/Obstetrics                         Anesthesia Physical Anesthesia Plan  ASA: III  Anesthesia Plan: General   Post-op Pain Management:    Induction: Intravenous  Airway Management Planned: Oral ETT  Additional Equipment:   Intra-op Plan:   Post-operative Plan: Extubation in OR  Informed Consent:   Plan Discussed with: Anesthesiologist, Surgeon and CRNA  Anesthesia Plan Comments:         Anesthesia Quick Evaluation

## 2012-08-19 NOTE — Op Note (Signed)
08/19/2012  11:22 AM  PATIENT:  Amanda Ponce  76 y.o. female  PRE-OPERATIVE DIAGNOSIS:  lumbar stenosis lumbar spondylosis lumbar degenerative disc disease lumbar radiculopathy, neurogenic claudication  POST-OPERATIVE DIAGNOSIS:  lumbar stenosis,lumbar spondylosis,lumbar degenerative disc disease,lumbar radiculopathy, neurogenic claudication  PROCEDURE:  Procedure(s): LUMBAR LAMINECTOMY/DECOMPRESSION 4 LEVELS:  L2-L5 decompressive lumbar laminectomy with decompression of the exiting L2, L3, L4, and L5 nerve roots bilaterally with microdissection microsurgical technique.  SURGEON:  Surgeon(s): Hewitt Shorts, MD Tia Alert, MD  ASSISTANTS: Tia Alert, M.D.  ANESTHESIA:   general  EBL:  Total I/O In: 1600 [I.V.:1600] Out: 150 [Blood:150]  BLOOD ADMINISTERED:none  COUNT: Correct per nursing staff  DICTATION: Patient was brought to the operating room placed under general endotracheal anesthesia. Patient was turned to a prone position the lumbar region was prepped with Betadine soap and solution and draped in a sterile fashion. The midline was infiltrated with local anesthetic with epinephrine. A midline incision was made carried down thru the subcutaneous tissue, bipolar cautery and electrocautery were used to maintain hemostasis. Dissection was carried down to the lumbar fascia which was incised bilaterally and the paraspinal muscles were dissected from the spinous process and lamina in a subperiosteal fashion. A localizing x-ray was taken and the L2-L5 levels were identified. Laminectomy was begun with double-action rongeurs, the high-speed drill and Kerrison punches. The thickened ligamentum flavum was carefully removed. Dissection was carried laterally to decompress the lateral stenosis taking care to leave the facet complexes intact. Particular attention was paid to decompress both the central canal, the lateral recesses, and the exiting neural foramina, so as to decompress  the thecal sac and exiting nerve roots bilaterally. Once the decompression was completed hemostasis was established with the use of bipolar cautery and Gelfoam with thrombin. Paraspinal muscles deep fascia and Scarpa's fascia were closed in separate layers with interrupted 1 undyed Vicryl sutures. The subcutaneous and subcuticular were closed with interrupted inverted 2-0 undyed Vicryl sutures. Skin edges were approximated with Dermabond.   PLAN OF CARE: Admit to inpatient   PATIENT DISPOSITION:  PACU - hemodynamically stable.   Delay start of Pharmacological VTE agent (>24hrs) due to surgical blood loss or risk of bleeding:  yes

## 2012-08-19 NOTE — H&P (Signed)
Subjective: Patient is a 76 y.o. female who is admitted for treatment of multilevel multifactorial lumbar stenosis. Patient complains of pain in her low back that extends down to the buttocks and posterior thighs bilaterally. She's been symptomatic for over 10 years and we have been doing periodic epidural steroids injections over the past 15 months. MRI scan shows moderate to marked multifactorial lumbar stenosis at L2-3 and L3-4 and severe multifactorial lumbar stenosis at L4-5. At this point the patient was proceed with surgical decompression for definitive intervention and is admitted for an L2-L5 decompressive lumbar laminectomy.    Past Medical History  Diagnosis Date  . Hypertension   . GERD (gastroesophageal reflux disease)   . Headache     Sinsus  . Seasonal allergies     Past Surgical History  Procedure Date  . Colonscopy   . Eye surgery     bil eyes  . Goiter   . Tonsillectomy 1948  . Appendectomy 1966  . Abdominal hysterectomy 2006  . Goiter 2008    Benign  . Cesarean section     x 2    Prescriptions prior to admission  Medication Sig Dispense Refill  . buPROPion (WELLBUTRIN XL) 300 MG 24 hr tablet Take 300 mg by mouth every morning.      Marland Kitchen dexlansoprazole (DEXILANT) 60 MG capsule Take 60 mg by mouth every morning.      . fluticasone (FLOVENT HFA) 110 MCG/ACT inhaler Inhale 2 puffs into the lungs 2 (two) times daily.      . hydrochlorothiazide (MICROZIDE) 12.5 MG capsule Take 12.5 mg by mouth every morning.      . traMADol (ULTRAM) 50 MG tablet Take 50 mg by mouth every 8 (eight) hours as needed. For pain.       No Known Allergies  History  Substance Use Topics  . Smoking status: Not on file  . Smokeless tobacco: Not on file  . Alcohol Use: No    History reviewed. No pertinent family history.   Review of Systems A comprehensive review of systems was negative.  Objective: Vital signs in last 24 hours: Temp:  [98.1 F (36.7 C)] 98.1 F (36.7 C) (09/18  0642) Pulse Rate:  [83] 83  (09/18 0642) Resp:  [18] 18  (09/18 0642) BP: (126)/(77) 126/77 mmHg (09/18 0642) SpO2:  [94 %] 94 % (09/18 0642)  EXAM: Patient is a well-developed well-nourished white female in no acute distress. Lungs are clear to auscultation , the patient has symmetrical respiratory excursion. Heart has a regular rate and rhythm normal S1 and S2 no murmur.   Abdomen is soft nontender nondistended bowel sounds are present. Extremity examination shows no clubbing cyanosis or edema. Musculoskeletal examination shows no tenderness to palpation of the lumbar spinous processes or paralumbar musculature. He is able to flex to 90, and able to extend to 10. Straight leg raising is negative bilaterally. Motor examination shows 5 over 5 strength in the lower extremities including the iliopsoas quadriceps dorsiflexor extensor hallicus  longus and plantar flexor bilaterally. Sensation is intact to pinprick in the distal lower extremities. Reflexes are symmetrical bilaterally. No pathologic reflexes are present. Patient has a normal gait and stance.    Data Review:CBC    Component Value Date/Time   WBC 8.1 08/10/2012 1446   RBC 4.41 08/10/2012 1446   HGB 14.1 08/10/2012 1446   HCT 40.7 08/10/2012 1446   PLT 248 08/10/2012 1446   MCV 92.3 08/10/2012 1446   MCH 32.0 08/10/2012 1446  MCHC 34.6 08/10/2012 1446   RDW 12.3 08/10/2012 1446                          BMET    Component Value Date/Time   NA 137 08/10/2012 1446   K 3.8 08/10/2012 1446   CL 97 08/10/2012 1446   CO2 31 08/10/2012 1446   GLUCOSE 94 08/10/2012 1446   BUN 9 08/10/2012 1446   CREATININE 0.73 08/10/2012 1446   CALCIUM 9.7 08/10/2012 1446   GFRNONAA 82* 08/10/2012 1446   GFRAA >90 08/10/2012 1446     Assessment/Plan: Patient with multilevel multifactorial lumbar stenosis admitted for L2-L5 decompressive lumbar laminectomy. I've discussed with the patient the nature of his condition, the nature the surgical procedure, the typical length of  surgery, hospital stay, and overall recuperation. We discussed limitations postoperatively. I discussed risks of surgery including risks of infection, bleeding, possibly need for transfusion, the risk of nerve root dysfunction with pain, weakness, numbness, or paresthesias, or risk of dural tear and CSF leakage and possible need for further surgery, and the risk of anesthetic complications including myocardial infarction, stroke, pneumonia, and death. Understanding all this the patient does wish to proceed with surgery and is admitted for such.   Hewitt Shorts, MD 08/19/2012 7:42 AM

## 2012-08-19 NOTE — Anesthesia Procedure Notes (Signed)
Procedure Name: Intubation Date/Time: 08/19/2012 8:36 AM Performed by: Jefm Miles E Pre-anesthesia Checklist: Patient identified, Timeout performed, Emergency Drugs available, Suction available and Patient being monitored Patient Re-evaluated:Patient Re-evaluated prior to inductionOxygen Delivery Method: Circle system utilized Preoxygenation: Pre-oxygenation with 100% oxygen Intubation Type: IV induction Ventilation: Mask ventilation without difficulty Laryngoscope Size: Mac and 3 Grade View: Grade I Tube type: Oral Tube size: 7.0 mm Number of attempts: 1 Airway Equipment and Method: Stylet Placement Confirmation: ETT inserted through vocal cords under direct vision,  breath sounds checked- equal and bilateral and positive ETCO2 Secured at: 22 cm Tube secured with: Tape Dental Injury: Teeth and Oropharynx as per pre-operative assessment

## 2012-08-20 ENCOUNTER — Encounter (HOSPITAL_COMMUNITY): Payer: Self-pay | Admitting: Neurosurgery

## 2012-08-20 MED ORDER — OXYCODONE-ACETAMINOPHEN 5-325 MG PO TABS: 1.0000 | ORAL_TABLET | ORAL | Status: DC | PRN

## 2012-08-20 MED ORDER — CYCLOBENZAPRINE HCL 10 MG PO TABS: 5.0000 mg | ORAL_TABLET | Freq: Three times a day (TID) | ORAL | Status: DC | PRN

## 2012-08-20 NOTE — Discharge Summary (Signed)
Physician Discharge Summary  Patient ID: Amanda Ponce MRN: [id number] DOB/AGE: December 19, 1935 76 y.o.  Admit date: 08/19/2012 Discharge date: 08/20/2012  Admission Diagnoses:  Lumbar stenosis, lumbar spondylosis, lumbar degenerative disc disease, neurogenic claudication  Discharge Diagnoses:  Lumbar stenosis, lumbar spondylosis, lumbar degenerative disc disease, neurogenic claudication  Discharged Condition: good  Hospital Course: Patient was admitted underwent an L2-L5 decompressive lumbar laminectomy. She is done well following surgery. She is up and living actively. She is voiding well. Her wound is clean and dry. She's been given instructions regarding wound care and activities following discharge. She is to return for followup with me in the office in 3-4 weeks.  Discharge Exam: Blood pressure 120/67, pulse 63, temperature 97.9 F (36.6 C), temperature source Oral, resp. rate 20, SpO2 94.00%.  Disposition: Home     Medication List     As of 08/20/2012  8:16 AM    TAKE these medications         buPROPion 300 MG 24 hr tablet   Commonly known as: WELLBUTRIN XL   Take 300 mg by mouth every morning.      cyclobenzaprine 10 MG tablet   Commonly known as: FLEXERIL   Take 0.5-1 tablets (5-10 mg total) by mouth 3 (three) times daily as needed for muscle spasms.      DEXILANT 60 MG capsule   Generic drug: dexlansoprazole   Take 60 mg by mouth every morning.      fluticasone 110 MCG/ACT inhaler   Commonly known as: FLOVENT HFA   Inhale 2 puffs into the lungs 2 (two) times daily.      hydrochlorothiazide 12.5 MG capsule   Commonly known as: MICROZIDE   Take 12.5 mg by mouth every morning.      oxyCODONE-acetaminophen 5-325 MG per tablet   Commonly known as: PERCOCET/ROXICET   Take 1-2 tablets by mouth every 4 (four) hours as needed for pain.      traMADol 50 MG tablet   Commonly known as: ULTRAM   Take 50 mg by mouth every 8 (eight) hours as needed. For pain.          Signed: Hewitt Shorts, MD 08/20/2012, 8:16 AM

## 2012-08-20 NOTE — Progress Notes (Signed)
Pt doing very well. Pt is OOB ambulating independently and pain is controlled. Pt given D/C instructions with Rx's, verbal understanding given. Pt D/C'd home via wheelchair with family @ 1100 per MD order. Rema Fendt, RN

## 2013-01-16 ENCOUNTER — Other Ambulatory Visit: Payer: Self-pay

## 2013-07-07 ENCOUNTER — Other Ambulatory Visit: Payer: Self-pay

## 2013-08-23 ENCOUNTER — Telehealth: Payer: Self-pay | Admitting: Family Medicine

## 2013-08-23 NOTE — Telephone Encounter (Signed)
Pt states that she is the mother of your current patient Amanda Ponce, and she would also like to establish as your patient. Please advise.

## 2013-08-24 NOTE — Telephone Encounter (Signed)
L/M on voicemail / ga

## 2013-08-24 NOTE — Telephone Encounter (Signed)
I am sorry but I am not able to take her on, thanks

## 2013-08-25 NOTE — Telephone Encounter (Signed)
Pt aware.

## 2013-10-07 ENCOUNTER — Other Ambulatory Visit: Payer: Self-pay

## 2013-10-19 DIAGNOSIS — K5790 Diverticulosis of intestine, part unspecified, without perforation or abscess without bleeding: Secondary | ICD-10-CM | POA: Insufficient documentation

## 2014-08-04 DIAGNOSIS — M1990 Unspecified osteoarthritis, unspecified site: Secondary | ICD-10-CM | POA: Insufficient documentation

## 2015-07-24 ENCOUNTER — Other Ambulatory Visit (HOSPITAL_COMMUNITY): Payer: Self-pay | Admitting: Orthopaedic Surgery

## 2015-07-27 ENCOUNTER — Encounter (HOSPITAL_COMMUNITY)
Admission: RE | Admit: 2015-07-27 | Discharge: 2015-07-27 | Disposition: A | Discharge status: Home / Self Care | Payer: Medicare HMO | Source: Ambulatory Visit | Attending: Orthopaedic Surgery | Admitting: Orthopaedic Surgery

## 2015-07-27 ENCOUNTER — Encounter (HOSPITAL_COMMUNITY): Payer: Self-pay

## 2015-07-27 DIAGNOSIS — Z01812 Encounter for preprocedural laboratory examination: Secondary | ICD-10-CM | POA: Insufficient documentation

## 2015-07-27 DIAGNOSIS — Z01810 Encounter for preprocedural cardiovascular examination: Secondary | ICD-10-CM | POA: Diagnosis not present

## 2015-07-27 DIAGNOSIS — M1612 Unilateral primary osteoarthritis, left hip: Secondary | ICD-10-CM | POA: Insufficient documentation

## 2015-07-27 HISTORY — DX: Unspecified osteoarthritis, unspecified site: M19.90

## 2015-07-27 HISTORY — DX: Respiratory tuberculosis unspecified: A15.9

## 2015-07-27 HISTORY — DX: Personal history of other medical treatment: Z92.89

## 2015-07-27 HISTORY — DX: Personal history of other diseases of the digestive system: Z87.19

## 2015-07-27 LAB — CBC
HCT: 37.6 % (ref 36.0–46.0)
Hemoglobin: 13 g/dL (ref 12.0–15.0)
MCH: 32.3 pg (ref 26.0–34.0)
MCHC: 34.6 g/dL (ref 30.0–36.0)
MCV: 93.3 fL (ref 78.0–100.0)
PLATELETS: 220 10*3/uL (ref 150–400)
RBC: 4.03 MIL/uL (ref 3.87–5.11)
RDW: 12.3 % (ref 11.5–15.5)
WBC: 6.8 10*3/uL (ref 4.0–10.5)

## 2015-07-27 LAB — SURGICAL PCR SCREEN
MRSA, PCR: NEGATIVE
STAPHYLOCOCCUS AUREUS: NEGATIVE

## 2015-07-27 LAB — BASIC METABOLIC PANEL
Anion gap: 9 (ref 5–15)
BUN: 22 mg/dL — AB (ref 6–20)
CHLORIDE: 99 mmol/L — AB (ref 101–111)
CO2: 31 mmol/L (ref 22–32)
CREATININE: 1.17 mg/dL — AB (ref 0.44–1.00)
Calcium: 9.4 mg/dL (ref 8.9–10.3)
GFR calc Af Amer: 51 mL/min — ABNORMAL LOW (ref >60)
GFR, EST NON AFRICAN AMERICAN: 44 mL/min — AB (ref >60)
Glucose, Bld: 94 mg/dL (ref 65–99)
Potassium: 3.3 mmol/L — ABNORMAL LOW (ref 3.5–5.1)
SODIUM: 139 mmol/L (ref 135–145)

## 2015-07-27 NOTE — Progress Notes (Signed)
Last visit to PCP, at least a yr. Ago because she voices that she is embarrassed about her weight.  Pt. States she has not had labs or checkup with PCP since gaining a total of 50 lbs.

## 2015-07-27 NOTE — Pre-Procedure Instructions (Signed)
Amanda Ponce  07/27/2015      [id number] - [address], [place] - [address] [name] [address] Phone: [phone] Fax: [phone]  [name] [name], [place] - [address] AT [facility] OF [address] & [address] [place] [address] Phone: [phone] Fax: [phone]    Your procedure is scheduled on 08/08/2015  Report to [facility] Admitting at 9:45 A.M.  Call this number if you have problems the morning of surgery:  [phone]   Remember:  Do not eat food or drink liquids after midnight. On Monday  Take these medicines the morning of surgery with A SIP OF WATER : take Dexilant if its the day to take it, use inhaler if you need it   Do not wear jewelry, make-up or nail polish.   Do not wear lotions, powders, or perfumes.  You may wear deodorant.   Do not shave 48 hours prior to surgery.     Do not bring valuables to the hospital.   [facility] is not responsible for any belongings or valuables.  Contacts, dentures or bridgework may not be worn into surgery.  Leave your suitcase in the car.  After surgery it may be brought to your room.  For patients admitted to the hospital, discharge time will be determined by your treatment team.  Patients discharged the day of surgery will not be allowed to drive home.   Name and phone number of your driver:   /w family Special instructions:  Special Instructions: [place] - Preparing for Surgery  Before surgery, you can play an important role.  Because skin is not sterile, your skin needs to be as free of germs as possible.  You can reduce the number of germs on you skin by washing with CHG (chlorahexidine gluconate) soap before surgery.  CHG is an antiseptic cleaner which kills germs and bonds with the skin to continue killing germs even after washing.  Please DO NOT use if you have an allergy to CHG or antibacterial  soaps.  If your skin becomes reddened/irritated stop using the CHG and inform your nurse when you arrive at Short Stay.  Do not shave (including legs and underarms) for at least 48 hours prior to the first CHG shower.  You may shave your face.  Please follow these instructions carefully:   1.  Shower with CHG Soap the night before surgery and the  morning of Surgery.  2.  If you choose to wash your hair, wash your hair first as usual with your  normal shampoo.  3.  After you shampoo, rinse your hair and body thoroughly to remove the  Shampoo.  4.  Use CHG as you would any other liquid soap.  You can apply chg directly to the skin and wash gently with scrungie or a clean washcloth.  5.  Apply the CHG Soap to your body ONLY FROM THE NECK DOWN.    Do not use on open wounds or open sores.  Avoid contact with your eyes, ears, mouth and genitals (private parts).  Wash genitals (private parts)   with your normal soap.  6.  Wash thoroughly, paying special attention to the area where your surgery will be performed.  7.  Thoroughly rinse your body with warm water from the neck down.  8.  DO NOT shower/wash with your normal soap after using and rinsing off  the CHG Soap.  9.  Pat yourself dry with a clean towel.            10.  Wear clean pajamas.            11.  Place clean sheets on your bed the night of your first shower and do not sleep with pets.  Day of Surgery  Do not apply any lotions/deodorants the morning of surgery.  Please wear clean clothes to the hospital/surgery center.  Please read over the following fact sheets that you were given. Pain Booklet, Coughing and Deep Breathing, MRSA Information and Surgical Site Infection Prevention

## 2015-08-08 ENCOUNTER — Inpatient Hospital Stay (HOSPITAL_COMMUNITY)
Admission: RE | Admit: 2015-08-08 | Discharge: 2015-08-11 | DRG: 470 | Disposition: A | Discharge status: Skilled Nursing Facility | Payer: Medicare HMO | Source: Ambulatory Visit | Attending: Orthopaedic Surgery | Admitting: Orthopaedic Surgery

## 2015-08-08 ENCOUNTER — Inpatient Hospital Stay (HOSPITAL_COMMUNITY): Payer: Medicare HMO | Admitting: Certified Registered Nurse Anesthetist

## 2015-08-08 ENCOUNTER — Inpatient Hospital Stay (HOSPITAL_COMMUNITY): Payer: Medicare HMO

## 2015-08-08 ENCOUNTER — Encounter (HOSPITAL_COMMUNITY): Payer: Self-pay | Admitting: Certified Registered Nurse Anesthetist

## 2015-08-08 ENCOUNTER — Encounter (HOSPITAL_COMMUNITY)
Admission: RE | Disposition: A | Discharge status: Skilled Nursing Facility | Payer: Self-pay | Source: Ambulatory Visit | Attending: Orthopaedic Surgery

## 2015-08-08 DIAGNOSIS — D62 Acute posthemorrhagic anemia: Secondary | ICD-10-CM | POA: Diagnosis not present

## 2015-08-08 DIAGNOSIS — K219 Gastro-esophageal reflux disease without esophagitis: Secondary | ICD-10-CM | POA: Diagnosis present

## 2015-08-08 DIAGNOSIS — F17200 Nicotine dependence, unspecified, uncomplicated: Secondary | ICD-10-CM | POA: Diagnosis present

## 2015-08-08 DIAGNOSIS — J302 Other seasonal allergic rhinitis: Secondary | ICD-10-CM | POA: Diagnosis present

## 2015-08-08 DIAGNOSIS — M1611 Unilateral primary osteoarthritis, right hip: Secondary | ICD-10-CM | POA: Diagnosis present

## 2015-08-08 DIAGNOSIS — M1612 Unilateral primary osteoarthritis, left hip: Secondary | ICD-10-CM | POA: Diagnosis present

## 2015-08-08 DIAGNOSIS — I10 Essential (primary) hypertension: Secondary | ICD-10-CM | POA: Diagnosis present

## 2015-08-08 DIAGNOSIS — Z419 Encounter for procedure for purposes other than remedying health state, unspecified: Secondary | ICD-10-CM

## 2015-08-08 DIAGNOSIS — Z96649 Presence of unspecified artificial hip joint: Secondary | ICD-10-CM | POA: Insufficient documentation

## 2015-08-08 DIAGNOSIS — Z96642 Presence of left artificial hip joint: Secondary | ICD-10-CM

## 2015-08-08 HISTORY — PX: TOTAL HIP ARTHROPLASTY: SHX124

## 2015-08-08 SURGERY — ARTHROPLASTY, HIP, TOTAL, ANTERIOR APPROACH
Anesthesia: Monitor Anesthesia Care | Site: Hip | Laterality: Left

## 2015-08-08 MED ORDER — LACTATED RINGERS IV SOLN
INTRAVENOUS | Status: DC
  Administered 2015-08-08 (×2): via INTRAVENOUS

## 2015-08-08 MED ORDER — ACETAMINOPHEN 325 MG PO TABS: 325.0000 mg | ORAL_TABLET | ORAL | Status: DC | PRN

## 2015-08-08 MED ORDER — PHENYLEPHRINE HCL 10 MG/ML IJ SOLN
INTRAMUSCULAR | Status: AC
  Filled 2015-08-08: qty 1

## 2015-08-08 MED ORDER — DOCUSATE SODIUM 100 MG PO CAPS
100.0000 mg | ORAL_CAPSULE | Freq: Two times a day (BID) | ORAL | Status: DC
  Administered 2015-08-08 – 2015-08-11 (×5): 100 mg via ORAL
  Filled 2015-08-08 (×6): qty 1

## 2015-08-08 MED ORDER — PROPOFOL 10 MG/ML IV BOLUS
INTRAVENOUS | Status: DC | PRN
  Administered 2015-08-08: 140 mg via INTRAVENOUS

## 2015-08-08 MED ORDER — HYDROMORPHONE HCL 1 MG/ML IJ SOLN
0.2500 mg | INTRAMUSCULAR | Status: DC | PRN
  Administered 2015-08-08: 0.5 mg via INTRAVENOUS

## 2015-08-08 MED ORDER — OXYCODONE HCL 5 MG/5ML PO SOLN: 5.0000 mg | Freq: Once | ORAL | Status: DC | PRN

## 2015-08-08 MED ORDER — SUCCINYLCHOLINE CHLORIDE 20 MG/ML IJ SOLN
INTRAMUSCULAR | Status: DC | PRN
  Administered 2015-08-08: 60 mg via INTRAVENOUS

## 2015-08-08 MED ORDER — FENTANYL CITRATE (PF) 250 MCG/5ML IJ SOLN
INTRAMUSCULAR | Status: AC
  Filled 2015-08-08: qty 5

## 2015-08-08 MED ORDER — MIDAZOLAM HCL 2 MG/2ML IJ SOLN
INTRAMUSCULAR | Status: AC
  Filled 2015-08-08: qty 4

## 2015-08-08 MED ORDER — CEFAZOLIN SODIUM-DEXTROSE 2-3 GM-% IV SOLR
2.0000 g | INTRAVENOUS | Status: AC
  Administered 2015-08-08: 2 g via INTRAVENOUS
  Filled 2015-08-08: qty 50

## 2015-08-08 MED ORDER — PHENYLEPHRINE HCL 10 MG/ML IJ SOLN
10.0000 mg | INTRAVENOUS | Status: DC | PRN
  Administered 2015-08-08: 10 ug/min via INTRAVENOUS

## 2015-08-08 MED ORDER — BUPIVACAINE HCL (PF) 0.5 % IJ SOLN
INTRAMUSCULAR | Status: AC
  Filled 2015-08-08: qty 10

## 2015-08-08 MED ORDER — FENTANYL CITRATE (PF) 100 MCG/2ML IJ SOLN
INTRAMUSCULAR | Status: DC | PRN
  Administered 2015-08-08: 50 ug via INTRAVENOUS
  Administered 2015-08-08: 25 ug via INTRAVENOUS
  Administered 2015-08-08: 50 ug via INTRAVENOUS
  Administered 2015-08-08: 25 ug via INTRAVENOUS
  Administered 2015-08-08 (×3): 50 ug via INTRAVENOUS
  Administered 2015-08-08: 100 ug via INTRAVENOUS

## 2015-08-08 MED ORDER — NEOSTIGMINE METHYLSULFATE 10 MG/10ML IV SOLN
INTRAVENOUS | Status: AC
  Filled 2015-08-08: qty 1

## 2015-08-08 MED ORDER — ACETAMINOPHEN 160 MG/5ML PO SOLN
325.0000 mg | ORAL | Status: DC | PRN
  Filled 2015-08-08: qty 20.3

## 2015-08-08 MED ORDER — METHOCARBAMOL 500 MG PO TABS
ORAL_TABLET | ORAL | Status: AC
  Filled 2015-08-08: qty 1

## 2015-08-08 MED ORDER — CEFAZOLIN SODIUM 1-5 GM-% IV SOLN
1.0000 g | Freq: Four times a day (QID) | INTRAVENOUS | Status: AC
  Administered 2015-08-08 – 2015-08-09 (×2): 1 g via INTRAVENOUS
  Filled 2015-08-08 (×2): qty 50

## 2015-08-08 MED ORDER — HYDROCHLOROTHIAZIDE 25 MG PO TABS
25.0000 mg | ORAL_TABLET | Freq: Every day | ORAL | Status: DC
  Administered 2015-08-08 – 2015-08-09 (×2): 25 mg via ORAL
  Filled 2015-08-08 (×4): qty 1

## 2015-08-08 MED ORDER — DIPHENHYDRAMINE HCL 12.5 MG/5ML PO ELIX: 12.5000 mg | ORAL_SOLUTION | ORAL | Status: DC | PRN

## 2015-08-08 MED ORDER — METHOCARBAMOL 500 MG PO TABS
500.0000 mg | ORAL_TABLET | Freq: Four times a day (QID) | ORAL | Status: DC | PRN
  Administered 2015-08-08 – 2015-08-09 (×2): 500 mg via ORAL
  Filled 2015-08-08 (×3): qty 1

## 2015-08-08 MED ORDER — TRANEXAMIC ACID 1000 MG/10ML IV SOLN
1000.0000 mg | INTRAVENOUS | Status: AC
  Administered 2015-08-08: 1000 mg via INTRAVENOUS
  Filled 2015-08-08: qty 10

## 2015-08-08 MED ORDER — ALUM & MAG HYDROXIDE-SIMETH 200-200-20 MG/5ML PO SUSP
30.0000 mL | ORAL | Status: DC | PRN
  Administered 2015-08-09: 30 mL via ORAL
  Filled 2015-08-08: qty 30

## 2015-08-08 MED ORDER — OXYCODONE HCL 5 MG PO TABS
5.0000 mg | ORAL_TABLET | ORAL | Status: DC | PRN
  Administered 2015-08-08 – 2015-08-09 (×5): 10 mg via ORAL
  Administered 2015-08-09: 5 mg via ORAL
  Administered 2015-08-09: 10 mg via ORAL
  Administered 2015-08-10: 5 mg via ORAL
  Filled 2015-08-08 (×5): qty 2
  Filled 2015-08-08: qty 1
  Filled 2015-08-08 (×2): qty 2
  Filled 2015-08-08: qty 1

## 2015-08-08 MED ORDER — ONDANSETRON HCL 4 MG/2ML IJ SOLN
INTRAMUSCULAR | Status: AC
  Filled 2015-08-08: qty 2

## 2015-08-08 MED ORDER — METOCLOPRAMIDE HCL 5 MG PO TABS: 5.0000 mg | ORAL_TABLET | Freq: Three times a day (TID) | ORAL | Status: DC | PRN

## 2015-08-08 MED ORDER — POLYETHYLENE GLYCOL 3350 17 G PO PACK: 17.0000 g | PACK | Freq: Every day | ORAL | Status: DC | PRN

## 2015-08-08 MED ORDER — MENTHOL 3 MG MT LOZG: 1.0000 | LOZENGE | OROMUCOSAL | Status: DC | PRN

## 2015-08-08 MED ORDER — PROPOFOL 10 MG/ML IV BOLUS
INTRAVENOUS | Status: AC
  Filled 2015-08-08: qty 20

## 2015-08-08 MED ORDER — DEXAMETHASONE SODIUM PHOSPHATE 4 MG/ML IJ SOLN
INTRAMUSCULAR | Status: DC | PRN
  Administered 2015-08-08: 8 mg via INTRAVENOUS

## 2015-08-08 MED ORDER — OXYCODONE HCL 5 MG PO TABS
5.0000 mg | ORAL_TABLET | Freq: Once | ORAL | Status: DC | PRN
Start: 2015-08-08 — End: 2015-08-08

## 2015-08-08 MED ORDER — DEXAMETHASONE SODIUM PHOSPHATE 4 MG/ML IJ SOLN
INTRAMUSCULAR | Status: AC
  Filled 2015-08-08: qty 2

## 2015-08-08 MED ORDER — MIDAZOLAM HCL 5 MG/5ML IJ SOLN
INTRAMUSCULAR | Status: DC | PRN
  Administered 2015-08-08: 2 mg via INTRAVENOUS

## 2015-08-08 MED ORDER — LIDOCAINE HCL (CARDIAC) 20 MG/ML IV SOLN
INTRAVENOUS | Status: DC | PRN
  Administered 2015-08-08: 50 mg via INTRAVENOUS

## 2015-08-08 MED ORDER — SODIUM CHLORIDE 0.9 % IR SOLN
Status: DC | PRN
  Administered 2015-08-08: 3000 mL

## 2015-08-08 MED ORDER — GLYCOPYRROLATE 0.2 MG/ML IJ SOLN
INTRAMUSCULAR | Status: DC | PRN
  Administered 2015-08-08: 0.6 mg via INTRAVENOUS

## 2015-08-08 MED ORDER — ROCURONIUM BROMIDE 100 MG/10ML IV SOLN
INTRAVENOUS | Status: DC | PRN
  Administered 2015-08-08: 40 mg via INTRAVENOUS

## 2015-08-08 MED ORDER — ONDANSETRON HCL 4 MG/2ML IJ SOLN
4.0000 mg | Freq: Four times a day (QID) | INTRAMUSCULAR | Status: DC | PRN
  Administered 2015-08-08 – 2015-08-10 (×3): 4 mg via INTRAVENOUS
  Filled 2015-08-08 (×3): qty 2

## 2015-08-08 MED ORDER — 0.9 % SODIUM CHLORIDE (POUR BTL) OPTIME
TOPICAL | Status: DC | PRN
  Administered 2015-08-08: 1000 mL

## 2015-08-08 MED ORDER — ONDANSETRON HCL 4 MG/2ML IJ SOLN
INTRAMUSCULAR | Status: DC | PRN
  Administered 2015-08-08: 4 mg via INTRAVENOUS

## 2015-08-08 MED ORDER — ONDANSETRON HCL 4 MG PO TABS: 4.0000 mg | ORAL_TABLET | Freq: Four times a day (QID) | ORAL | Status: DC | PRN

## 2015-08-08 MED ORDER — METOCLOPRAMIDE HCL 5 MG/ML IJ SOLN: 5.0000 mg | Freq: Three times a day (TID) | INTRAMUSCULAR | Status: DC | PRN

## 2015-08-08 MED ORDER — METHOCARBAMOL 1000 MG/10ML IJ SOLN
500.0000 mg | Freq: Four times a day (QID) | INTRAVENOUS | Status: DC | PRN
  Filled 2015-08-08: qty 5

## 2015-08-08 MED ORDER — GLYCOPYRROLATE 0.2 MG/ML IJ SOLN
INTRAMUSCULAR | Status: AC
  Filled 2015-08-08: qty 3

## 2015-08-08 MED ORDER — ZOLPIDEM TARTRATE 5 MG PO TABS: 5.0000 mg | ORAL_TABLET | Freq: Every evening | ORAL | Status: DC | PRN

## 2015-08-08 MED ORDER — SODIUM CHLORIDE 0.9 % IV SOLN
INTRAVENOUS | Status: DC
  Administered 2015-08-08: 75 mL/h via INTRAVENOUS
  Administered 2015-08-09 – 2015-08-10 (×2): via INTRAVENOUS

## 2015-08-08 MED ORDER — OXYCODONE HCL 5 MG PO TABS
ORAL_TABLET | ORAL | Status: AC
  Filled 2015-08-08: qty 2

## 2015-08-08 MED ORDER — ACETAMINOPHEN 325 MG PO TABS
650.0000 mg | ORAL_TABLET | Freq: Four times a day (QID) | ORAL | Status: DC | PRN
  Administered 2015-08-11: 650 mg via ORAL
  Filled 2015-08-08 (×2): qty 2

## 2015-08-08 MED ORDER — NEOSTIGMINE METHYLSULFATE 10 MG/10ML IV SOLN
INTRAVENOUS | Status: DC | PRN
  Administered 2015-08-08: 3 mg via INTRAVENOUS

## 2015-08-08 MED ORDER — HYDROMORPHONE HCL 1 MG/ML IJ SOLN
INTRAMUSCULAR | Status: AC
  Filled 2015-08-08: qty 1

## 2015-08-08 MED ORDER — HYDROMORPHONE HCL 1 MG/ML IJ SOLN: 1.0000 mg | INTRAMUSCULAR | Status: DC | PRN

## 2015-08-08 MED ORDER — PHENOL 1.4 % MT LIQD: 1.0000 | OROMUCOSAL | Status: DC | PRN

## 2015-08-08 MED ORDER — ACETAMINOPHEN 650 MG RE SUPP
650.0000 mg | Freq: Four times a day (QID) | RECTAL | Status: DC | PRN
  Filled 2015-08-08: qty 1

## 2015-08-08 MED ORDER — ASPIRIN EC 325 MG PO TBEC
325.0000 mg | DELAYED_RELEASE_TABLET | Freq: Two times a day (BID) | ORAL | Status: DC
  Administered 2015-08-08 – 2015-08-11 (×6): 325 mg via ORAL
  Filled 2015-08-08 (×7): qty 1

## 2015-08-08 SURGICAL SUPPLY — 50 items
BENZOIN TINCTURE PRP APPL 2/3 (GAUZE/BANDAGES/DRESSINGS) IMPLANT
BLADE SAW SGTL 18X1.27X75 (BLADE) ×2 IMPLANT
BLADE SURG ROTATE 9660 (MISCELLANEOUS) IMPLANT
CAPT HIP TOTAL 2 ×2 IMPLANT
CELLS DAT CNTRL 66122 CELL SVR (MISCELLANEOUS) ×1 IMPLANT
COVER SURGICAL LIGHT HANDLE (MISCELLANEOUS) ×2 IMPLANT
DRAPE C-ARM 42X72 X-RAY (DRAPES) ×2 IMPLANT
DRAPE STERI IOBAN 125X83 (DRAPES) ×2 IMPLANT
DRAPE U-SHAPE 47X51 STRL (DRAPES) ×6 IMPLANT
DRSG AQUACEL AG ADV 3.5X10 (GAUZE/BANDAGES/DRESSINGS) ×2 IMPLANT
DURAPREP 26ML APPLICATOR (WOUND CARE) ×2 IMPLANT
ELECT BLADE 4.0 EZ CLEAN MEGAD (MISCELLANEOUS) ×2
ELECT BLADE 6.5 EXT (BLADE) ×2 IMPLANT
ELECT REM PT RETURN 9FT ADLT (ELECTROSURGICAL) ×2
ELECTRODE BLDE 4.0 EZ CLN MEGD (MISCELLANEOUS) ×1 IMPLANT
ELECTRODE REM PT RTRN 9FT ADLT (ELECTROSURGICAL) ×1 IMPLANT
FACESHIELD WRAPAROUND (MASK) ×4 IMPLANT
GAUZE XEROFORM 1X8 LF (GAUZE/BANDAGES/DRESSINGS) ×2 IMPLANT
GLOVE BIOGEL PI IND STRL 8 (GLOVE) ×2 IMPLANT
GLOVE BIOGEL PI INDICATOR 8 (GLOVE) ×2
GLOVE ECLIPSE 8.0 STRL XLNG CF (GLOVE) ×2 IMPLANT
GLOVE ORTHO TXT STRL SZ7.5 (GLOVE) ×4 IMPLANT
GOWN STRL REUS W/ TWL LRG LVL3 (GOWN DISPOSABLE) ×2 IMPLANT
GOWN STRL REUS W/ TWL XL LVL3 (GOWN DISPOSABLE) ×2 IMPLANT
GOWN STRL REUS W/TWL LRG LVL3 (GOWN DISPOSABLE) ×2
GOWN STRL REUS W/TWL XL LVL3 (GOWN DISPOSABLE) ×2
HANDPIECE INTERPULSE COAX TIP (DISPOSABLE) ×1
KIT BASIN OR (CUSTOM PROCEDURE TRAY) ×2 IMPLANT
KIT ROOM TURNOVER OR (KITS) ×2 IMPLANT
MANIFOLD NEPTUNE II (INSTRUMENTS) ×2 IMPLANT
NS IRRIG 1000ML POUR BTL (IV SOLUTION) ×2 IMPLANT
PACK TOTAL JOINT (CUSTOM PROCEDURE TRAY) ×2 IMPLANT
PACK UNIVERSAL I (CUSTOM PROCEDURE TRAY) ×2 IMPLANT
PAD ARMBOARD 7.5X6 YLW CONV (MISCELLANEOUS) ×4 IMPLANT
RTRCTR WOUND ALEXIS 18CM MED (MISCELLANEOUS) ×2
SET HNDPC FAN SPRY TIP SCT (DISPOSABLE) ×1 IMPLANT
STAPLER VISISTAT 35W (STAPLE) ×2 IMPLANT
STRIP CLOSURE SKIN 1/2X4 (GAUZE/BANDAGES/DRESSINGS) IMPLANT
SUT ETHIBOND NAB CT1 #1 30IN (SUTURE) ×4 IMPLANT
SUT MNCRL AB 4-0 PS2 18 (SUTURE) IMPLANT
SUT VIC AB 0 CT1 27 (SUTURE) ×1
SUT VIC AB 0 CT1 27XBRD ANBCTR (SUTURE) ×1 IMPLANT
SUT VIC AB 1 CT1 27 (SUTURE) ×1
SUT VIC AB 1 CT1 27XBRD ANBCTR (SUTURE) ×1 IMPLANT
SUT VIC AB 2-0 CT1 27 (SUTURE) ×2
SUT VIC AB 2-0 CT1 TAPERPNT 27 (SUTURE) ×2 IMPLANT
TOWEL OR 17X24 6PK STRL BLUE (TOWEL DISPOSABLE) ×2 IMPLANT
TOWEL OR 17X26 10 PK STRL BLUE (TOWEL DISPOSABLE) ×2 IMPLANT
TRAY FOLEY CATH 16FRSI W/METER (SET/KITS/TRAYS/PACK) ×2 IMPLANT
WATER STERILE IRR 1000ML POUR (IV SOLUTION) IMPLANT

## 2015-08-08 NOTE — H&P (Signed)
TOTAL HIP ADMISSION H&P  Patient is admitted for left total hip arthroplasty.  Subjective:  Chief Complaint: left hip pain  HPI: Amanda Ponce, 79 y.o. female, has a history of pain and functional disability in the left hip(s) due to arthritis and patient has failed non-surgical conservative treatments for greater than 12 weeks to include NSAID's and/or analgesics, corticosteriod injections, flexibility and strengthening excercises, supervised PT with diminished ADL's post treatment, use of assistive devices, weight reduction as appropriate and activity modification.  Onset of symptoms was gradual starting 3 years ago with gradually worsening course since that time.The patient noted no past surgery on the left hip(s).  Patient currently rates pain in the left hip at 10 out of 10 with activity. Patient has night pain, worsening of pain with activity and weight bearing, trendelenberg gait, pain that interfers with activities of daily living, pain with passive range of motion and crepitus. Patient has evidence of subchondral cysts, subchondral sclerosis, periarticular osteophytes and joint space narrowing by imaging studies. This condition presents safety issues increasing the risk of falls.  There is no current active infection.  Patient Active Problem List   Diagnosis Date Noted  . Osteoarthritis of left hip 08/08/2015   Past Medical History  Diagnosis Date  . Hypertension   . GERD (gastroesophageal reflux disease)   . Headache(784.0)     Sinsus  . Seasonal allergies   . History of hiatal hernia   . Arthritis     OA - L hip  . History of blood transfusion     as a child, transfusion post tonsillectomy- also states that she has areas inher nose that has bleeding tendencies   . 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      Past Surgical History  Procedure Laterality Date  . Colonscopy    . Eye surgery      bil eyes  . Goiter    . Tonsillectomy   1948  . Appendectomy  1966  . Abdominal hysterectomy  2006  . Goiter  2008    Benign  . Cesarean section      x 2  . Lumbar laminectomy/decompression microdiscectomy  08/19/2012    Procedure: LUMBAR LAMINECTOMY/DECOMPRESSION MICRODISCECTOMY 3 LEVELS;  Surgeon: Hewitt Shorts, MD;  Location: [facility];  Service: Neurosurgery;  Laterality: N/A;  Lumbar two-three, Lumbar three-four,Lumbar four-five laminectomies  . Back surgery      Prescriptions prior to admission  Medication Sig Dispense Refill Last Dose  . dexlansoprazole (DEXILANT) 60 MG capsule Take 60 mg by mouth every other day.    08/07/2015 at Unknown time  . diclofenac (CATAFLAM) 50 MG tablet TAKE 1 TABLET BY MOUTH  TWO OR THREE TIMES DAILY WITH FOOD FOR INFLAMMATION  0 08/07/2015 at Unknown time  . diphenhydrAMINE (BENADRYL) 25 MG tablet Take 25 mg by mouth at bedtime.   08/07/2015 at Unknown time  . GLUCOSAMINE HCL-MSM PO Take 1 tablet by mouth 2 (two) times daily.   Past Month at Unknown time  . hydrochlorothiazide (HYDRODIURIL) 25 MG tablet Take 25 mg by mouth daily.   08/07/2015 at Unknown time  . Probiotic Product (PROBIOTIC ADVANCED PO) Take 1 capsule by mouth 2 (two) times daily.   Past Week at Unknown time  . fluticasone (FLOVENT HFA) 110 MCG/ACT inhaler Inhale 2 puffs into the lungs 2 (two) times daily as needed.   More than a month at Unknown time  No Known Allergies  Social History  Substance Use Topics  . Smoking status: Former Smoker -- 1.00 packs/day for 56 years    Quit date: 11/25/2014  . Smokeless tobacco: Current User     Comment: pt. states she "VAPES"- but my breathing has improved.   . Alcohol Use: No    History reviewed. No pertinent family history.   Review of Systems  Musculoskeletal: Positive for back pain and joint pain.  All other systems reviewed and are negative.   Objective:  Physical Exam  Constitutional: She is oriented to person, place, and time. She appears well-developed and  well-nourished.  HENT:  Head: Normocephalic and atraumatic.  Eyes: EOM are normal. Pupils are equal, round, and reactive to light.  Neck: Normal range of motion. Neck supple.  Cardiovascular: Normal rate.   Respiratory: Effort normal and breath sounds normal.  GI: Soft. Bowel sounds are normal.  Musculoskeletal:       Left hip: She exhibits decreased range of motion, decreased strength, tenderness and bony tenderness.  Neurological: She is alert and oriented to person, place, and time.  Skin: Skin is warm and dry.  Psychiatric: She has a normal mood and affect.    Vital signs in last 24 hours: Temp:  [97.7 F (36.5 C)] 97.7 F (36.5 C) (09/06 1000) Pulse Rate:  [88] 88 (09/06 1000) Resp:  [20] 20 (09/06 1000) BP: (138)/(49) 138/49 mmHg (09/06 1000) SpO2:  [97 %] 97 % (09/06 1000) Weight:  [95.845 kg (211 lb 4.8 oz)] 95.845 kg (211 lb 4.8 oz) (09/06 1000)  Labs:   Estimated body mass index is 36.25 kg/(m^2) as calculated from the following:   Height as of this encounter:  (1.626 m).   Weight as of this encounter: 95.845 kg (211 lb 4.8 oz).   Imaging Review Plain radiographs demonstrate severe degenerative joint disease of the left hip(s). The bone quality appears to be good for age and reported activity level.  Assessment/Plan:  End stage arthritis, left hip(s)  The patient history, physical examination, clinical judgement of the provider and imaging studies are consistent with end stage degenerative joint disease of the left hip(s) and total hip arthroplasty is deemed medically necessary. The treatment options including medical management, injection therapy, arthroscopy and arthroplasty were discussed at length. The risks and benefits of total hip arthroplasty were presented and reviewed. The risks due to aseptic loosening, infection, stiffness, dislocation/subluxation,  thromboembolic complications and other imponderables were discussed.  The patient acknowledged the  explanation, agreed to proceed with the plan and consent was signed. Patient is being admitted for inpatient treatment for surgery, pain control, PT, OT, prophylactic antibiotics, VTE prophylaxis, progressive ambulation and ADL's and discharge planning.The patient is planning to be discharged home with home health services

## 2015-08-08 NOTE — Transfer of Care (Signed)
Immediate Anesthesia Transfer of Care Note  Patient: Amanda Ponce  Procedure(s) Performed: Procedure(s): LEFT TOTAL HIP ARTHROPLASTY ANTERIOR APPROACH (Left)  Patient Location: PACU  Anesthesia Type:General  Level of Consciousness: awake, alert , oriented, patient cooperative and responds to stimulation  Airway & Oxygen Therapy: Patient Spontanous Breathing and Patient connected to face mask oxygen  Post-op Assessment: Report given to RN, Post -op Vital signs reviewed and stable, Patient moving all extremities X 4 and Patient able to stick tongue midline  Post vital signs: Reviewed and stable  Last Vitals:  Filed Vitals:   08/08/15 1000  BP: 138/49  Pulse: 88  Temp: 36.5 C  Resp: 20    Complications:

## 2015-08-08 NOTE — Anesthesia Procedure Notes (Signed)
Procedure Name: Intubation Date/Time: 08/08/2015 12:17 PM Performed by: Marylyn Ishihara Pre-anesthesia Checklist: Patient identified, Timeout performed, Emergency Drugs available, Suction available and Patient being monitored Patient Re-evaluated:Patient Re-evaluated prior to inductionOxygen Delivery Method: Circle system utilized Preoxygenation: Pre-oxygenation with 100% oxygen Intubation Type: IV induction Ventilation: Mask ventilation without difficulty Laryngoscope Size: Mac and 3 Grade View: Grade I Tube type: Oral Tube size: 7.5 mm Number of attempts: 1 Airway Equipment and Method: Stylet Placement Confirmation: ETT inserted through vocal cords under direct vision,  positive ETCO2 and breath sounds checked- equal and bilateral Secured at: 21 cm Tube secured with: Tape Dental Injury: Teeth and Oropharynx as per pre-operative assessment

## 2015-08-08 NOTE — Anesthesia Preprocedure Evaluation (Signed)
Anesthesia Evaluation  Patient identified by MRN, date of birth, ID band Patient awake    Reviewed: Allergy & Precautions, NPO status , Patient's Chart, lab work & pertinent test results  History of Anesthesia Complications Negative for: history of anesthetic complications  Airway Mallampati: II  TM Distance: >3 FB Neck ROM: Full    Dental  (+) Upper Dentures, Lower Dentures   Pulmonary neg shortness of breath, neg sleep apnea, neg COPDneg recent URI, former smoker, neg PE breath sounds clear to auscultation        Cardiovascular hypertension, Pt. on medications - angina- CABG and - CHF - dysrhythmias Rhythm:Regular     Neuro/Psych  Headaches, Back surgery, no numbness or weakness  Neuromuscular disease    GI/Hepatic Neg liver ROS, hiatal hernia, GERD-  Medicated and Controlled,  Endo/Other  Morbid obesity  Renal/GU Renal InsufficiencyRenal disease     Musculoskeletal  (+) Arthritis -,   Abdominal   Peds  Hematology negative hematology ROS (+)   Anesthesia Other Findings   Reproductive/Obstetrics                             Anesthesia Physical Anesthesia Plan  ASA: II  Anesthesia Plan: MAC and Spinal   Post-op Pain Management:    Induction: Intravenous  Airway Management Planned: Nasal Cannula  Additional Equipment: None  Intra-op Plan:   Post-operative Plan:   Informed Consent: I have reviewed the patients History and Physical, chart, labs and discussed the procedure including the risks, benefits and alternatives for the proposed anesthesia with the patient or authorized representative who has indicated his/her understanding and acceptance.   Dental advisory given  Plan Discussed with: CRNA and Surgeon  Anesthesia Plan Comments:         Anesthesia Quick Evaluation

## 2015-08-08 NOTE — Progress Notes (Signed)
Utilization review completed.  

## 2015-08-08 NOTE — Brief Op Note (Signed)
08/08/2015  1:38 PM  PATIENT:  Amanda Ponce  79 y.o. female  PRE-OPERATIVE DIAGNOSIS:  osteoarthritis left hip  POST-OPERATIVE DIAGNOSIS:  osteoarthritis left hip  PROCEDURE:  Procedure(s): LEFT TOTAL HIP ARTHROPLASTY ANTERIOR APPROACH (Left)  SURGEON:  Surgeon(s) and Role:    * Kathryne Hitch, MD - Primary  PHYSICIAN ASSISTANT: Rexene Edison, PA-C  ANESTHESIA:   spinal and general  EBL:  Total I/O In: 1000 [I.V.:1000] Out: 550 [Urine:200; Blood:350]  BLOOD ADMINISTERED:none  DRAINS: none   LOCAL MEDICATIONS USED:  NONE  SPECIMEN:  No Specimen  DISPOSITION OF SPECIMEN:  N/A  COUNTS:  YES  TOURNIQUET:  * No tourniquets in log *  DICTATION: .Other Dictation: Dictation Number [id number]  PLAN OF CARE: Admit to inpatient   PATIENT DISPOSITION:  PACU - hemodynamically stable.   Delay start of Pharmacological VTE agent (>24hrs) due to surgical blood loss or risk of bleeding: no

## 2015-08-09 ENCOUNTER — Encounter (HOSPITAL_COMMUNITY): Payer: Self-pay | Admitting: Orthopaedic Surgery

## 2015-08-09 LAB — CBC
HEMATOCRIT: 29.9 % — AB (ref 36.0–46.0)
Hemoglobin: 9.9 g/dL — ABNORMAL LOW (ref 12.0–15.0)
MCH: 31.1 pg (ref 26.0–34.0)
MCHC: 33.1 g/dL (ref 30.0–36.0)
MCV: 94 fL (ref 78.0–100.0)
Platelets: 233 10*3/uL (ref 150–400)
RBC: 3.18 MIL/uL — AB (ref 3.87–5.11)
RDW: 12.5 % (ref 11.5–15.5)
WBC: 9.9 10*3/uL (ref 4.0–10.5)

## 2015-08-09 LAB — BASIC METABOLIC PANEL
ANION GAP: 10 (ref 5–15)
BUN: 20 mg/dL (ref 6–20)
CO2: 26 mmol/L (ref 22–32)
Calcium: 8.2 mg/dL — ABNORMAL LOW (ref 8.9–10.3)
Chloride: 101 mmol/L (ref 101–111)
Creatinine, Ser: 1.08 mg/dL — ABNORMAL HIGH (ref 0.44–1.00)
GFR calc Af Amer: 56 mL/min — ABNORMAL LOW (ref >60)
GFR calc non Af Amer: 48 mL/min — ABNORMAL LOW (ref >60)
GLUCOSE: 173 mg/dL — AB (ref 65–99)
POTASSIUM: 4.1 mmol/L (ref 3.5–5.1)
Sodium: 137 mmol/L (ref 135–145)

## 2015-08-09 NOTE — Op Note (Signed)
NAMESHERREE, SHANKMAN NO.:  [id number]  MEDICAL RECORD NO.:  [id number]  LOCATION:  [id number]                        FACILITY:  [facility]  PHYSICIAN:  Vanita Panda. Magnus Ivan, M.D.DATE OF BIRTH:  [birth year]  DATE OF PROCEDURE:  08/08/2015 DATE OF DISCHARGE:                              OPERATIVE REPORT   PREOPERATIVE DIAGNOSIS:  Severe primary osteoarthritis and degenerative joint disease, left hip.  POSTOPERATIVE DIAGNOSIS:  Severe primary osteoarthritis and degenerative joint disease, left hip.  PROCEDURE:  Left total hip arthroplasty through direct anterior approach.  IMPLANTS:  DePuy Sector Gription acetabular component size 50, size 32 +0 neutral polyethylene liner, size 11 Corail femoral component with standard offset, size 32 +1 ceramic hip ball.  SURGEON:  Vanita Panda. Magnus Ivan, M.D.  ANESTHESIA: 1. Attempted spinal. 2. General.  ANTIBIOTICS:  2 g IV Ancef.  ESTIMATED BLOOD LOSS:  300 to 400 mL.  COMPLICATIONS:  None.  INDICATIONS:  Ms. Connelly is a 79 year old female with debilitating arthritis involving her left hip.  She has x-rays that show complete loss of the joint space, periarticular osteophytes, sclerotic changes. Her pain is daily, her mobility is limited, and it has impacted her quality of life.  It is 10/10 pain when it does occur.  She has failed all measures of conservative treatment and does wish to proceed with a total hip arthroplasty through direct anterior approach.  She understands the risk of acute blood loss anemia, nerve and vessel injury, fracture, infection, dislocation, DVT.  She understands our goals are decreased pain, improved mobility, and overall improved quality of life.  DESCRIPTION OF PROCEDURE:  After informed consent was obtained, appropriate left hip was marked.  She was brought to the operating room where spinal anesthesia was attempted on a stretcher.  She was then laid in supine position, but was not  effective.  A Foley catheter was placed and general anesthesia was obtained.  Traction boots were placed on both of her feet.  Next, she was placed supine on the Hana fracture table with the perineal post in place and both legs in in-line skeletal traction devices, but no traction applied.  Her left operative hip was then prepped and draped with DuraPrep and sterile drapes.  A time-out was called.  She was verified as correct patient and correct left hip. I then made an incision inferior and posterior to the anterior superior iliac spine and carried this obliquely down the leg.  We dissected down the tensor fascia lata muscle and the tensor fascia was then divided longitudinally, so we could proceed with a direct anterior approach to the hip.  We identified and cauterized the lateral femoral circumflex vessels.  I then identified the hip capsule.  I opened the hip capsule in an L-type format, finding a large joint effusion.  We then made our femoral neck cut with an oscillating saw just proximal to the lesser trochanter and completed this with an osteotome.  I placed a corkscrew guide in the femoral head and removed the femoral head in its entirety and found it to be completely devoid of cartilage.  We passed this off the table and then cleaned the remnants of acetabular labrum from  around the labrum and placed a bent Hohmann over the medial acetabular rim.  We then began reaming from a size 42 reamer up to a size 50 with all reamers placed under direct visualization and last reamer under direct fluoroscopy, so we could obtain our depth of reaming, our inclination, and anteversion.  Once we were pleased with this, we placed the real DePuy Sector Gription acetabular component size 50 and a 32 +0 neutral polyethylene liner for that size acetabular component.  Attention was then turned to the femur.  With the leg externally rotated to 100 degrees extended and adducted, we were able to place  the Mueller retractor by the medial femur and Bent Hohmann behind the greater trochanter.  I released the lateral joint capsule and used a box cutting osteotome to enter the femoral canal and the rongeur to lateralize.  We then began broaching from a size 8 broach using the Corail broaching system up to a size 11.  With a size 11, we had a nice tight, so we trialed a standard neck and a 32 +1 hip ball.  Brought the leg back over and up with traction and internal rotation, reducing the pelvis and we were pleased with stability, leg length, and offset.  We then dislocated the hip and removed the trial components.  We then placed the real Corail femoral component with standard offset followed by the real 32 +1 ceramic hip ball.  We then reduced this in the pelvis and again we were pleased with range of motion, stability, offset, and leg lengths.  We then washed the hip out with normal saline solution using pulsatile lavage.  We closed the joint capsule with interrupted #1 Ethibond suture followed by a running #1 Vicryl in the tensor fascia, 0 Vicryl in the deep tissue, 2-0 Vicryl in the subcutaneous tissues, interrupted staples on the skin.  Xeroform and Aquacel dressing were applied.  She was then taken off the Hana table, awakened, extubated, and taken to the recovery room in stable condition.  All final counts were correct.  There were no complications noted.  Of note, Richardean Canal, PA-C, assisted in the entire case.  His assistance was crucial for facilitating all aspects of this case.     Vanita Panda. Magnus Ivan, M.D.     CYB/MEDQ  D:  08/08/2015  T:  08/09/2015  Job:  [id number]

## 2015-08-09 NOTE — Progress Notes (Signed)
Occupational Therapy Evaluation Patient Details Name: Amanda Ponce MRN: [id number] DOB: 09/06/1936 Today's Date: 08/09/2015    History of Present Illness Patient is a 79 y/o female s/p left THA. PMH includes HTN.   Clinical Impression   Patient presenting with deconditioning, acute pain, decreased I in self care, decreased I in functional mobility/transfers.  Patient was Mod I  PTA. Patient currently functioning min - max A for self care. Patient will benefit from acute OT to increase overall independence in the areas of ADLs, functional mobility, safety in order to safely discharge to SNF for continued OT intervention.    Follow Up Recommendations  SNF    Equipment Recommendations  Other (comment) (defer to next venue)    Recommendations for Other Services  (none)     Precautions / Restrictions Precautions Precautions: Fall Restrictions Weight Bearing Restrictions: Yes LLE Weight Bearing: Weight bearing as tolerated      Mobility Bed Mobility Overal bed mobility: Needs Assistance Bed Mobility: Sit to Supine     Supine to sit: Mod assist;HOB elevated     General bed mobility comments: assistance to bring L LE into bed and scoot towards head of bed. Use of rails.  Transfers Overall transfer level: Needs assistance Equipment used: Rolling walker (2 wheeled) Transfers: Sit to/from [employer] Sit to Stand: Mod assist Stand pivot transfers: Max assist       General transfer comment: Mod A to boost up from recliner chair with cues for hand placment and technique. Max A for toilet transfer for lift and lowering assistance.     Balance Overall balance assessment: Needs assistance Sitting-balance support: Feet supported;Single extremity supported Sitting balance-Leahy Scale: Fair     Standing balance support: During functional activity Standing balance-Leahy Scale: Poor Standing balance comment: use of RW and min A for support                             ADL Overall ADL's : Needs assistance/impaired     Grooming: Wash/dry hands;Wash/dry face;Oral care;Applying deodorant;Brushing hair;Set up;Sitting   Upper Body Bathing: Set up;Sitting   Lower Body Bathing: Maximal assistance;Cueing for safety;Sit to/from stand   Upper Body Dressing : Set up;Sitting   Lower Body Dressing: Total assistance;Cueing for safety;Sit to/from stand   Toilet Transfer: Maximal assistance;BSC;RW   Toileting- Clothing Manipulation and Hygiene: Sit to/from stand;Maximal assistance         General ADL Comments: Pt seated in recliner chair upon entering the room. Pt with increased pain this session but declining pain medication as she reports feeling nauseated. Pt performed 4 side steps to the R from recliner chair for max A transfer onto [facility] with use of RW. Pt required assistance with clothing management  but able to perform hygiene while standing with balance assistance.                  Pertinent Vitals/Pain Pain Assessment: Faces Faces Pain Scale: Hurts even more Pain Location: L hip Pain Descriptors / Indicators: Guarding;Sore;Aching Pain Intervention(s): Monitored during session;Repositioned     Hand Dominance Right   Extremity/Trunk Assessment Upper Extremity Assessment Upper Extremity Assessment: Generalized weakness   Lower Extremity Assessment Lower Extremity Assessment: Defer to PT evaluation LLE Deficits / Details: Limited AROM/strength secondary to pain and recent surgery. LLE Sensation:  [facility].)       Communication Communication Communication: No difficulties   Cognition Arousal/Alertness: Awake/alert Behavior During Therapy: WFL for tasks assessed/performed Overall  Cognitive Status: Within Functional Limits for tasks assessed       Memory: Decreased short-term memory                        Home Living Family/patient expects to be discharged to:: Skilled nursing facility Living Arrangements:  Alone            Prior Functioning/Environment Level of Independence: Independent with assistive device(s)        Comments: Using 2 SPC PTA. Drives. cooks, cleans. Been staying with daughter for the last week. Borrowed a Occupational hygienist.    OT Diagnosis: Generalized weakness;Acute pain   OT Problem List: Decreased strength;Decreased knowledge of use of DME or AE;Decreased activity tolerance;Impaired balance (sitting and/or standing);Decreased safety awareness;Pain   OT Treatment/Interventions: Self-care/ADL training;Balance training;Therapeutic exercise;Therapeutic activities;Energy conservation;DME and/or AE instruction;Patient/family education    OT Goals(Current goals can be found in the care plan section) Acute Rehab OT Goals Patient Stated Goal: to go home OT Goal Formulation: With patient Time For Goal Achievement: 08/23/15 Potential to Achieve Goals: Good ADL Goals Pt Will Perform Upper Body Bathing: with modified independence;sitting Pt Will Perform Lower Body Bathing: sit to/from stand;with supervision Pt Will Perform Upper Body Dressing: with modified independence;sitting Pt Will Perform Lower Body Dressing: with supervision;with adaptive equipment;sit to/from stand Pt Will Transfer to Toilet: with modified independence;bedside commode Pt Will Perform Toileting - Clothing Manipulation and hygiene: with modified independence;sit to/from stand  OT Frequency: Min 2X/week   Barriers to D/C:    none known. Pt lives alone. SNF recommended          End of Session Equipment Utilized During Treatment: Rolling walker;Other (comment);Oxygen (BSC)  Activity Tolerance: Patient limited by pain;Patient limited by fatigue Patient left: in bed;with call bell/phone within reach;with bed alarm set   Time: 1415-1440 OT Time Calculation (min): 25 min Charges:  OT General Charges $OT Visit: 1 Procedure OT Treatments $Self Care/Home Management : 8-22 mins  Lowella Grip, MS,  OTR/L 08/09/2015, 3:01 PM

## 2015-08-09 NOTE — Progress Notes (Signed)
Subjective: 1 Day Post-Op Procedure(s) (LRB): LEFT TOTAL HIP ARTHROPLASTY ANTERIOR APPROACH (Left) Patient reports pain as moderate.  Asymptomatic acute blood loss anemia.  Objective: Vital signs in last 24 hours: Temp:  [97.5 F (36.4 C)-98.7 F (37.1 C)] 98.7 F (37.1 C) (09/07 0457) Pulse Rate:  [60-101] 101 (09/07 0457) Resp:  [11-20] 16 (09/07 0457) BP: (109-138)/(41-60) 109/47 mmHg (09/07 0457) SpO2:  [93 %-99 %] 97 % (09/07 0457) Weight:  [95.845 kg (211 lb 4.8 oz)] 95.845 kg (211 lb 4.8 oz) (09/06 1000)  Intake/Output from previous day: 09/06 0701 - 09/07 0700 In: 2887.5 [P.O.:240; I.V.:2647.5] Out: 1430 [Urine:1080; Blood:350] Intake/Output this shift: Total I/O In: 275 [Other:275] Out: -    Recent Labs  08/09/15 0429  HGB 9.9*    Recent Labs  08/09/15 0429  WBC 9.9  RBC 3.18*  HCT 29.9*  PLT 233    Recent Labs  08/09/15 0429  NA 137  K 4.1  CL 101  CO2 26  BUN 20  CREATININE 1.08*  GLUCOSE 173*  CALCIUM 8.2*   No results for input(s): LABPT, INR in the last 72 hours.  Sensation intact distally Intact pulses distally Dorsiflexion/Plantar flexion intact Incision: scant drainage  Assessment/Plan: 1 Day Post-Op Procedure(s) (LRB): LEFT TOTAL HIP ARTHROPLASTY ANTERIOR APPROACH (Left) Up with therapy Discharge to SNF by Friday.  BLACKMAN,CHRISTOPHER Y 08/09/2015, 8:03 AM

## 2015-08-09 NOTE — Evaluation (Signed)
Physical Therapy Evaluation Patient Details Name: Amanda Ponce MRN: [id number] DOB: [birth year] Today's Date: 08/09/2015   History of Present Illness  Patient is a 79 y/o female s/p left THA. PMH includes HTN.  Clinical Impression  Patient presents with pain, lethargy and post surgical deficits LLE s/p Lt THA impacting mobility. PTA, pt using 2 SPC's Mod I for ambulation however in the last week pt staying with daughter due to increased difficulty getting around. Tolerated SPT and short distance ambulation with Min-Mod A due to weakness and pain. Increased time to perform all mobility. Instructed pt in exercises. Will follow acutely to maximize independence and mobility prior to discharge to ST SNF.    Follow Up Recommendations SNF;Supervision/Assistance - 24 hour    Equipment Recommendations  Rolling walker with 5" wheels    Recommendations for Other Services       Precautions / Restrictions Precautions Precautions: Fall Restrictions Weight Bearing Restrictions: Yes LLE Weight Bearing: Weight bearing as tolerated      Mobility  Bed Mobility Overal bed mobility: Needs Assistance Bed Mobility: Supine to Sit     Supine to sit: Mod assist;HOB elevated     General bed mobility comments: Assist to bring LLE to EOB, scoot bottom and elevate trunk. Increased time due to pain. Use of rails for support.  Transfers Overall transfer level: Needs assistance Equipment used: Rolling walker (2 wheeled) Transfers: Sit to/from [employer] Sit to Stand: Mod assist Stand pivot transfers: Min assist       General transfer comment: Mod A to boost from EOB x1. Cues for hand placement and technique. SPT bed to [facility] Min A for balance and to assist advancing RLE. + dizziness.   Ambulation/Gait Ambulation/Gait assistance: Mod assist Ambulation Distance (Feet): 4 Feet Assistive device: Rolling walker (2 wheeled) Gait Pattern/deviations: Step-to pattern;Decreased stance time -  left;Decreased step length - right;Trunk flexed;Antalgic   Gait velocity interpretation: <1.8 ft/sec, indicative of risk for recurrent falls General Gait Details: Able to take a few steps to chair with assist with weight shifting and to advance RLE. Increased time. Cues for breathing.   Stairs            Wheelchair Mobility    Modified Rankin (Stroke Patients Only)       Balance Overall balance assessment: Needs assistance Sitting-balance support: Feet supported;Single extremity supported Sitting balance-Leahy Scale: Fair     Standing balance support: During functional activity Standing balance-Leahy Scale: Poor Standing balance comment: Relient on RW and Min A for support.                              Pertinent Vitals/Pain Pain Assessment: Faces Faces Pain Scale: Hurts even more Pain Location: left hip Pain Descriptors / Indicators: Grimacing;Guarding;Sore;Aching Pain Intervention(s): Monitored during session;Repositioned;Premedicated before session;Limited activity within patient's tolerance    Home Living Family/patient expects to be discharged to:: Skilled nursing facility Living Arrangements: Alone                    Prior Function Level of Independence: Independent with assistive device(s)         Comments: Using 2 SPC PTA. Drives. cooks, cleans. Been staying with daughter for the last week. Borrowed a Occupational hygienist.     Hand Dominance        Extremity/Trunk Assessment   Upper Extremity Assessment: Defer to OT evaluation           Lower  Extremity Assessment: LLE deficits/detail;Generalized weakness   LLE Deficits / Details: Limited AROM/strength secondary to pain and recent surgery.     Communication   Communication: No difficulties  Cognition Arousal/Alertness: Awake/alert Behavior During Therapy: WFL for tasks assessed/performed Overall Cognitive Status: Within Functional Limits for tasks assessed       Memory:  Decreased short-term memory              General Comments General comments (skin integrity, edema, etc.): Pt's daughter present in room during session.     Exercises Total Joint Exercises Ankle Circles/Pumps: Both;15 reps;Supine Quad Sets: Both;10 reps;Seated Gluteal Sets: Both;10 reps;Seated Long Arc Quad: Left;10 reps;Seated      Assessment/Plan    PT Assessment Patient needs continued PT services  PT Diagnosis Difficulty walking;Acute pain;Generalized weakness   PT Problem List Decreased strength;Pain;Decreased activity tolerance;Decreased balance;Decreased mobility  PT Treatment Interventions Balance training;Gait training;Functional mobility training;Therapeutic activities;Therapeutic exercise;Patient/family education   PT Goals (Current goals can be found in the Care Plan section) Acute Rehab PT Goals Patient Stated Goal: to be able to do things around my house  PT Goal Formulation: With patient Time For Goal Achievement: 08/23/15 Potential to Achieve Goals: Good    Frequency 7X/week   Barriers to discharge Decreased caregiver support Pt lives alone.    Co-evaluation               End of Session Equipment Utilized During Treatment: Gait belt Activity Tolerance: Patient tolerated treatment well;Patient limited by pain Patient left: in chair;with call bell/phone within reach;with family/visitor present Nurse Communication: Mobility status         Time: 1100-1143 PT Time Calculation (min) (ACUTE ONLY): 43 min   Charges:   PT Evaluation $Initial PT Evaluation Tier I: 1 Procedure PT Treatments $Therapeutic Activity: 23-37 mins   PT G Codes:        Mekiah Cambridge A Fortino Haag 08/09/2015, 12:21 PM Mylo Red, PT, DPT [phone]

## 2015-08-09 NOTE — Clinical Social Work Note (Signed)
CSW received referral for possible SNF placement at time of discharge.  CSW spoke with patient's daughter, Alba Cory ([phone]), regarding discharge disposition. Patient's daughter stated preference for [facility].  Full assessment to follow.  Marcelline Deist, [place] - [phone] Clinical Social Work Department Orthopedics [phone]) and Surgical [phone])

## 2015-08-10 LAB — CBC
HEMATOCRIT: 27.9 % — AB (ref 36.0–46.0)
HEMOGLOBIN: 9.1 g/dL — AB (ref 12.0–15.0)
MCH: 31.3 pg (ref 26.0–34.0)
MCHC: 32.6 g/dL (ref 30.0–36.0)
MCV: 95.9 fL (ref 78.0–100.0)
Platelets: 185 10*3/uL (ref 150–400)
RBC: 2.91 MIL/uL — ABNORMAL LOW (ref 3.87–5.11)
RDW: 12.9 % (ref 11.5–15.5)
WBC: 8.1 10*3/uL (ref 4.0–10.5)

## 2015-08-10 MED ORDER — PANTOPRAZOLE SODIUM 40 MG PO TBEC
40.0000 mg | DELAYED_RELEASE_TABLET | Freq: Every day | ORAL | Status: DC
  Administered 2015-08-11: 40 mg via ORAL
  Filled 2015-08-10 (×2): qty 1

## 2015-08-10 MED ORDER — SODIUM CHLORIDE 0.9 % IJ SOLN
10.0000 mL | INTRAMUSCULAR | Status: DC | PRN
  Administered 2015-08-10 (×2): 10 mL via INTRAVENOUS
  Filled 2015-08-10 (×2): qty 10

## 2015-08-10 MED ORDER — PROMETHAZINE HCL 25 MG PO TABS
12.5000 mg | ORAL_TABLET | Freq: Four times a day (QID) | ORAL | Status: DC | PRN
  Administered 2015-08-10: 12.5 mg via ORAL
  Filled 2015-08-10: qty 1

## 2015-08-10 MED ORDER — HYDROCODONE-ACETAMINOPHEN 5-325 MG PO TABS: 1.0000 | ORAL_TABLET | ORAL | Status: DC | PRN

## 2015-08-10 NOTE — Progress Notes (Signed)
Subjective: 2 Days Post-Op Procedure(s) (LRB): LEFT TOTAL HIP ARTHROPLASTY ANTERIOR APPROACH (Left) Patient reports pain as moderate.  Still some nausea this am.  Hgb down to 9.1, but stable.   Objective: Vital signs in last 24 hours: Temp:  [98.2 F (36.8 C)-98.7 F (37.1 C)] 98.7 F (37.1 C) (09/08 0538) Pulse Rate:  [85-102] 102 (09/08 0538) Resp:  [18] 18 (09/08 0538) BP: (114-135)/(48-60) 128/58 mmHg (09/08 0538) SpO2:  [95 %-97 %] 96 % (09/08 0538)  Intake/Output from previous day: 09/07 0701 - 09/08 0700 In: 995 [P.O.:720] Out: -  Intake/Output this shift:     Recent Labs  08/09/15 0429 08/10/15 0615  HGB 9.9* 9.1*    Recent Labs  08/09/15 0429 08/10/15 0615  WBC 9.9 8.1  RBC 3.18* 2.91*  HCT 29.9* 27.9*  PLT 233 185    Recent Labs  08/09/15 0429  NA 137  K 4.1  CL 101  CO2 26  BUN 20  CREATININE 1.08*  GLUCOSE 173*  CALCIUM 8.2*   No results for input(s): LABPT, INR in the last 72 hours.  Sensation intact distally Intact pulses distally Dorsiflexion/Plantar flexion intact Incision: dressing C/D/I  Assessment/Plan: 2 Days Post-Op Procedure(s) (LRB): LEFT TOTAL HIP ARTHROPLASTY ANTERIOR APPROACH (Left) Up with therapy Discharge to SNF hopefully tomorrow 9/9.  Amanda Ponce Y 08/10/2015, 7:53 AM

## 2015-08-10 NOTE — Clinical Social Work Placement (Signed)
   CLINICAL SOCIAL WORK PLACEMENT  NOTE  Date:  08/10/2015  Patient Details  Name: Amanda Ponce MRN: [id number] Date of Birth: [birth year]  Clinical Social Work is seeking post-discharge placement for this patient at the Skilled  Nursing Facility level of care (*CSW will initial, date and re-position this form in  chart as items are completed):  Yes   Patient/family provided with [place] Clinical Social Work Department's list of facilities offering this level of care within the geographic area requested by the patient (or if unable, by the patient's family).  Yes   Patient/family informed of their freedom to choose among providers that offer the needed level of care, that participate in Medicare, Medicaid or managed care program needed by the patient, have an available bed and are willing to accept the patient.  Yes   Patient/family informed of [employer]'s ownership interest in [facility] and [facility], as well as of the fact that they are under no obligation to receive care at these facilities.  PASRR submitted to EDS on 08/10/15     PASRR number received on 08/10/15     Existing PASRR number confirmed on  (n/a)     FL2 transmitted to all facilities in geographic area requested by pt/family on 08/10/15     FL2 transmitted to all facilities within larger geographic area on 08/10/15     Patient informed that his/her managed care company has contracts with or will negotiate with certain facilities, including the following:   (yes, [facility])     Yes   Patient/family informed of bed offers received.  Patient chooses bed at [facility]     Physician recommends and patient chooses bed at  (n/a)    Patient to be transferred to [facility] on  .  Patient to be transferred to facility by [facility]AR     Patient family notified on   of transfer.  Name of family member notified:        PHYSICIAN Please sign FL2     Additional Comment:     _______________________________________________ Rod Mae, LCSW 08/10/2015, 12:24 PM

## 2015-08-10 NOTE — Progress Notes (Signed)
Physical Therapy Treatment Patient Details Name: Amanda Ponce MRN: [id number] DOB: [birth year] Today's Date: 08/10/2015    History of Present Illness Patient is a 79 y/o female s/p left THA. PMH includes HTN.    PT Comments    Session limited today secondary to nausea and emesis. Tolerated there ex sitting in chair. Able to stand with Min A of 1 and increased time. Movements seem to cause increased nausea. Pt given zofran prior to tx however does not seem to helping. RN notified MD. Will plan to focus on gait training next session as tolerated. Will continue to follow per current POC.  Follow Up Recommendations  SNF;Supervision/Assistance - 24 hour     Equipment Recommendations  Rolling walker with 5" wheels    Recommendations for Other Services       Precautions / Restrictions Precautions Precautions: Fall Restrictions Weight Bearing Restrictions: Yes LLE Weight Bearing: Weight bearing as tolerated    Mobility  Bed Mobility               General bed mobility comments: Sitting in chair upon PT arrival.  Transfers Overall transfer level: Needs assistance Equipment used: Rolling walker (2 wheeled) Transfers: Sit to/from Stand Sit to Stand: Min assist         General transfer comment: Min A to boost up from recliner with cues for hand placement and technique. Increased time. Assist for controlled descent into chair. + emesis.  Ambulation/Gait Ambulation/Gait assistance:  (deferred secondary to emesis and nausea.)               Stairs            Wheelchair Mobility    Modified Rankin (Stroke Patients Only)       Balance Overall balance assessment: Needs assistance Sitting-balance support: Feet supported;Single extremity supported Sitting balance-Leahy Scale: Fair     Standing balance support: During functional activity Standing balance-Leahy Scale: Poor                      Cognition Arousal/Alertness: Awake/alert Behavior  During Therapy: WFL for tasks assessed/performed Overall Cognitive Status: Within Functional Limits for tasks assessed       Memory: Decreased short-term memory              Exercises Total Joint Exercises Ankle Circles/Pumps: Both;15 reps;Seated Quad Sets: Both;10 reps;Seated Gluteal Sets: Both;10 reps;Seated Hip ABduction/ADduction: Both;10 reps;Seated Long Arc Quad: Left;10 reps;Seated    General Comments General comments (skin integrity, edema, etc.): Pt just given zofran 20 minutes prior to PT treatmemt due to + emesis eariler in AM.      Pertinent Vitals/Pain Pain Assessment: Faces Faces Pain Scale: Hurts little more Pain Location: left hip Pain Descriptors / Indicators: Guarding;Grimacing;Sore Pain Intervention(s): Monitored during session;Repositioned (not taking pain meds because they make her nauseous. RN notifiying MD to change meds)    Home Living                      Prior Function            PT Goals (current goals can now be found in the care plan section) Progress towards PT goals: Not progressing toward goals - comment (2/2 to nausea, vomiting)    Frequency  7X/week    PT Plan Current plan remains appropriate    Co-evaluation             End of Session Equipment Utilized During Treatment: Gait belt Activity Tolerance:  Patient limited by pain;Other (comment) (nausea, vomiting) Patient left: in chair;with call bell/phone within reach;with nursing/sitter in room     Time: 1135-1153 PT Time Calculation (min) (ACUTE ONLY): 18 min  Charges:  $Therapeutic Exercise: 8-22 mins                    G Codes:      Xela Oregel A Imanol Bihl 08/10/2015, 12:38 PM Mylo Red, PT, DPT [phone]

## 2015-08-10 NOTE — Discharge Instructions (Signed)

## 2015-08-10 NOTE — Plan of Care (Signed)
Problem: Consults Goal: Diagnosis- Total Joint Replacement Primary Total Hip     

## 2015-08-10 NOTE — Anesthesia Postprocedure Evaluation (Signed)
  Anesthesia Post-op Note  Patient: Amanda Ponce  Procedure(s) Performed: Procedure(s): LEFT TOTAL HIP ARTHROPLASTY ANTERIOR APPROACH (Left)  Patient Location: PACU  Anesthesia Type:General  Level of Consciousness: awake  Airway and Oxygen Therapy: Patient Spontanous Breathing  Post-op Pain: mild  Post-op Assessment: Post-op Vital signs reviewed, Patient's Cardiovascular Status Stable, Respiratory Function Stable, Patent Airway, No signs of Nausea or vomiting and Pain level controlled LLE Motor Response: Purposeful movement, Responds to commands LLE Sensation: No numbness, No tingling, Full sensation, Pain          Post-op Vital Signs: Reviewed and stable  Last Vitals:  Filed Vitals:   08/10/15 0538  BP: 128/58  Pulse: 102  Temp: 37.1 C  Resp: 18    Complications: No apparent anesthesia complications

## 2015-08-10 NOTE — Care Management Note (Signed)
Case Management Note  Patient Details  Name: Amanda Ponce MRN: [id number] Date of Birth: September 24, 1936  Subjective/Objective:           S/p left total hip arthroplasty         Action/Plan: PT/OT recommended SNF, referral made to CSW. Will continue to follow.  Expected Discharge Date:                  Expected Discharge Plan:  Skilled Nursing Facility  In-House Referral:  Clinical Social Work  Discharge planning Services  CM Consult  Post Acute Care Choice:  NA Choice offered to:     DME Arranged:    DME Agency:     HH Arranged:    HH Agency:     Status of Service:  In process, will continue to follow  Medicare Important Message Given:    Date Medicare IM Given:    Medicare IM give by:    Date Additional Medicare IM Given:    Additional Medicare Important Message give by:     If discussed at Long Length of Stay Meetings, dates discussed:    Additional Comments:  Monica Becton, RN 08/10/2015, 8:14 AM

## 2015-08-10 NOTE — Clinical Social Work Note (Signed)
Clinical Social Work Assessment  Patient Details  Name: Amanda Ponce MRN: [id number] Date of Birth: May 05, 1936  Date of referral:  08/10/15               Reason for consult:  Facility Placement, Discharge Planning                Permission sought to share information with:  Facility Medical sales representative, Family Supports Permission granted to share information::  Yes, Verbal Permission Granted  Name::     Susie Dunford  Agency::  [employer] (preference [place])  Relationship::  daughter  Contact Information:  [phone]  Housing/Transportation Living arrangements for the past 2 months:  Single Family Home Source of Information:  Adult Children Patient Interpreter Needed:  None Criminal Activity/Legal Involvement Pertinent to Current Situation/Hospitalization:  No - Comment as needed Significant Relationships:  Adult Children Lives with:  Self Do you feel safe going back to the place where you live?  No (High fall risk.) Need for family participation in patient care:  Yes (Comment) (Patient's daughter active in patient's care.)  Care giving concerns:  Patient's daughter expressed no concerns at this time.   Social Worker assessment / plan:  CSW received referral for possible SNF placement at time of discharge. CSW spoke with patient's daughter regarding discharge disposition. Per patient's daughter, patient and patient's family understanding and agreeable to PT recommendation for SNF placement at time of discharge. Patient's daughter reports patient and patient's family preference is for [facility]. CSW to continue to follow and assist with discharge planning needs.  Employment status:  Retired Education officer, [employer]) PT Recommendations:  Skilled Nursing Facility Information / Referral to community resources:  Skilled Nursing Facility  Patient/Family's Response to care:  Patient and patient's family understanding and agreeable to CSW  plan of care.  Patient/Family's Understanding of and Emotional Response to Diagnosis, Current Treatment, and Prognosis:  Patient and patient's family understanding and agreeable to CSW plan of care.  Emotional Assessment Appearance:  Appears stated age Attitude/Demeanor/Rapport:  Other (CSW spoke with patient's daughter who is assisting patient with discharge planning.) Affect (typically observed):  Other (CSW spoke with patient's daughter who is assisting patient with discharge planning.) Orientation:  Oriented to Self, Oriented to Place, Oriented to  Time, Oriented to Situation Alcohol / Substance use:  Not Applicable Psych involvement (Current and /or in the community):  No (Comment) (Not appropriate on this admission.)  Discharge Needs  Concerns to be addressed:  No discharge needs identified Readmission within the last 30 days:  No Current discharge risk:  None Barriers to Discharge:  No Barriers Identified   Rod Mae, LCSW 08/10/2015, 12:21 PM [phone]

## 2015-08-11 LAB — CBC
HCT: 27.1 % — ABNORMAL LOW (ref 36.0–46.0)
Hemoglobin: 9 g/dL — ABNORMAL LOW (ref 12.0–15.0)
MCH: 32.3 pg (ref 26.0–34.0)
MCHC: 33.2 g/dL (ref 30.0–36.0)
MCV: 97.1 fL (ref 78.0–100.0)
PLATELETS: 197 10*3/uL (ref 150–400)
RBC: 2.79 MIL/uL — AB (ref 3.87–5.11)
RDW: 13 % (ref 11.5–15.5)
WBC: 10 10*3/uL (ref 4.0–10.5)

## 2015-08-11 MED ORDER — HYDROCODONE-ACETAMINOPHEN 5-325 MG PO TABS: 1.0000 | ORAL_TABLET | ORAL | Status: DC | PRN

## 2015-08-11 MED ORDER — ASPIRIN 325 MG PO TBEC: 325.0000 mg | DELAYED_RELEASE_TABLET | Freq: Two times a day (BID) | ORAL | Status: DC

## 2015-08-11 NOTE — Progress Notes (Signed)
Occupational Therapy Treatment Patient Details Name: Amanda Ponce MRN: [id number] DOB: 1936-05-30 Today's Date: 08/11/2015    History of present illness Patient is a 79 y/o female s/p left THA. PMH includes HTN.   OT comments  Pt progressing well toward OT goals. Pt discharging to SNF today. Pt currently mod A for LB dressing, setup for UB dressing and bathing, and min guard for stand pivot transfer to [facility]. Continue to follow pt acutely.   Follow Up Recommendations  SNF    Equipment Recommendations  Other (comment)    Recommendations for Other Services      Precautions / Restrictions Precautions Precautions: Fall Restrictions Weight Bearing Restrictions: Yes LLE Weight Bearing: Weight bearing as tolerated       Mobility Bed Mobility Overal bed mobility: Needs Assistance Bed Mobility: Supine to Sit     Supine to sit: Min assist;HOB elevated     General bed mobility comments: Pt found in recliner, returned to recliner at end of session  Transfers Overall transfer level: Needs assistance Equipment used: Rolling walker (2 wheeled) Transfers: Stand Pivot Transfers Sit to Stand: Min guard Stand pivot transfers: Min guard       General transfer comment: Min guard for sit <> stand from recliner <> BSC    Balance Overall balance assessment: Needs assistance Sitting-balance support: Feet supported;Single extremity supported Sitting balance-Leahy Scale: Fair     Standing balance support: During functional activity Standing balance-Leahy Scale: Poor Standing balance comment: Pt standing with min guard to perform LB dressing                   ADL Overall ADL's : Needs assistance/impaired         Upper Body Bathing: Set up;Sitting       Upper Body Dressing : Set up;Sitting   Lower Body Dressing: Moderate assistance;Sit to/from stand Lower Body Dressing Details (indicate cue type and reason): Mod A to thread legs through [employer] Transfer:  Stand-pivot;BSC;RW;Min guard   Toileting- Architect and Hygiene: Min guard;Sit to/from stand       Functional mobility during ADLs: Min guard        Vision                     Perception     Praxis      Cognition   Behavior During Therapy: [facility] for tasks assessed/performed Overall Cognitive Status: Within Functional Limits for tasks assessed                       Extremity/Trunk Assessment               Exercises Total Joint Exercises Ankle Circles/Pumps: Both;10 reps;Seated Quad Sets: Both;10 reps;Seated Long Arc Quad: Left;10 reps;Seated   Shoulder Instructions       General Comments      Pertinent Vitals/ Pain       Pain Assessment: 0-10 Pain Score: 4  Faces Pain Scale: Hurts a little bit Pain Location: L hip Pain Descriptors / Indicators: Grimacing;Sore Pain Intervention(s): Monitored during session;Repositioned  Home Living                                          Prior Functioning/Environment              Frequency Min 2X/week     Progress Toward  Goals  OT Goals(current goals can now be found in the care plan section)  Progress towards OT goals: Progressing toward goals  Acute Rehab OT Goals Patient Stated Goal: none stated Time For Goal Achievement: 08/23/15 Potential to Achieve Goals: Good  Plan Discharge plan remains appropriate    Co-evaluation                 End of Session Equipment Utilized During Treatment: Rolling walker;Gait belt;Oxygen;Other (comment) (BSC)   Activity Tolerance Patient limited by pain   Patient Left in chair;with call bell/phone within reach   Nurse Communication Other (comment) (RN notified to reattach IV following UB dressing)        Time: 6578-4696 OT Time Calculation (min): 25 min  Charges: OT General Charges $OT Visit: 1 Procedure OT Treatments $Self Care/Home Management : 23-37 mins  Gaye Alken M.S., OTR/L Pager: [phone]   08/11/2015, 12:08 PM

## 2015-08-11 NOTE — Care Management Note (Signed)
Case Management Note  Patient Details  Name: RACHNA SCHONBERGER MRN: [id number] Date of Birth: 26-Apr-1936  Subjective/Objective:               S/p left total hip arthroplasty     Action/Plan: PT/OT recommended SNF, referred to CSW, plan is for discharge to [facility].  Expected Discharge Date:                  Expected Discharge Plan:  Skilled Nursing Facility  In-House Referral:  Clinical Social Work  Discharge planning Services  CM Consult  Post Acute Care Choice:  NA Choice offered to:     DME Arranged:    DME Agency:     HH Arranged:    HH Agency:     Status of Service:  Completed, signed off  Medicare Important Message Given:    Date Medicare IM Given:    Medicare IM give by:    Date Additional Medicare IM Given:    Additional Medicare Important Message give by:     If discussed at Long Length of Stay Meetings, dates discussed:    Additional Comments:  Monica Becton, RN 08/11/2015, 9:59 AM

## 2015-08-11 NOTE — Discharge Summary (Signed)
Patient ID: Amanda Ponce MRN: [id number] DOB/AGE: 1936/03/19 79 y.o.  Admit date: 08/08/2015 Discharge date: 08/11/2015  Admission Diagnoses:  Principal Problem:   Osteoarthritis of left hip Active Problems:   Status post total replacement of left hip   Discharge Diagnoses:  Same  Past Medical History  Diagnosis Date  . Hypertension   . GERD (gastroesophageal reflux disease)   . Headache(784.0)     Sinsus  . Seasonal allergies   . History of hiatal hernia   . Arthritis     OA - L hip  . History of blood transfusion     as a child, transfusion post tonsillectomy- also states that she has areas inher nose that has bleeding tendencies   . 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      Surgeries: Procedure(s): LEFT TOTAL HIP ARTHROPLASTY ANTERIOR APPROACH on 08/08/2015   Consultants:    Discharged Condition: Improved  Hospital Course: Amanda Ponce is an 79 y.o. female who was admitted 08/08/2015 for operative treatment ofOsteoarthritis of left hip. Patient has severe unremitting pain that affects sleep, daily activities, and work/hobbies. After pre-op clearance the patient was taken to the operating room on 08/08/2015 and underwent  Procedure(s): LEFT TOTAL HIP ARTHROPLASTY ANTERIOR APPROACH.    Patient was given perioperative antibiotics: Anti-infectives    Start     Dose/Rate Route Frequency Ordered Stop   08/08/15 1800  ceFAZolin (ANCEF) IVPB 1 g/50 mL premix     1 g 100 mL/hr over 30 Minutes Intravenous Every 6 hours 08/08/15 1618 08/09/15 0300   08/08/15 0957  ceFAZolin (ANCEF) IVPB 2 g/50 mL premix     2 g 100 mL/hr over 30 Minutes Intravenous On call to O.R. 08/08/15 0957 08/08/15 1203       Patient was given sequential compression devices, early ambulation, and chemoprophylaxis to prevent DVT.  Patient benefited maximally from hospital stay and there were no complications.    Recent vital signs: Patient Vitals for the past  24 hrs:  BP Temp Pulse Resp SpO2  08/11/15 0610 120/63 mmHg 100.2 F (37.9 C) (!) 110 18 98 %  08/10/15 2008 (!) 123/56 mmHg 98.9 F (37.2 C) (!) 108 18 98 %  08/10/15 1215 (!) 119/49 mmHg 99.9 F (37.7 C) (!) 108 18 -     Recent laboratory studies:  Recent Labs  08/09/15 0429 08/10/15 0615 08/11/15 0530  WBC 9.9 8.1 10.0  HGB 9.9* 9.1* 9.0*  HCT 29.9* 27.9* 27.1*  PLT 233 185 197  NA 137  --   --   K 4.1  --   --   CL 101  --   --   CO2 26  --   --   BUN 20  --   --   CREATININE 1.08*  --   --   GLUCOSE 173*  --   --   CALCIUM 8.2*  --   --      Discharge Medications:     Medication List    TAKE these medications        aspirin 325 MG EC tablet  Take 1 tablet (325 mg total) by mouth 2 (two) times daily after a meal.     DEXILANT 60 MG capsule  Generic drug:  dexlansoprazole  Take 60 mg by mouth every other day.     diclofenac 50 MG tablet  Commonly known as:  CATAFLAM  TAKE 1 TABLET  BY MOUTH  TWO OR THREE TIMES DAILY WITH FOOD FOR INFLAMMATION     diphenhydrAMINE 25 MG tablet  Commonly known as:  BENADRYL  Take 25 mg by mouth at bedtime.     fluticasone 110 MCG/ACT inhaler  Commonly known as:  FLOVENT HFA  Inhale 2 puffs into the lungs 2 (two) times daily as needed.     GLUCOSAMINE HCL-MSM PO  Take 1 tablet by mouth 2 (two) times daily.     hydrochlorothiazide 25 MG tablet  Commonly known as:  HYDRODIURIL  Take 25 mg by mouth daily.     HYDROcodone-acetaminophen 5-325 MG per tablet  Commonly known as:  NORCO/VICODIN  Take 1 tablet by mouth every 4 (four) hours as needed for moderate pain.     PROBIOTIC ADVANCED PO  Take 1 capsule by mouth 2 (two) times daily.        Diagnostic Studies: Dg Hip Port Unilat With Pelvis 1v Left  08/08/2015   CLINICAL DATA:  79 year old female status post left total hip arthroplasty. Initial encounter.  EXAM: DG HIP (WITH OR WITHOUT PELVIS) 1V PORT LEFT  COMPARISON:  Intraoperative images from 1326 hours today.   FINDINGS: Portable supine AP and cross-table lateral views of the left hip. Bipolar type hip arthroplasty components appear intact and normally aligned. Surrounding postoperative changes including to the soft tissues the subcutaneous gas and skin staples. Elsewhere the visualized pelvis and proximal right femur appear intact. There is right hip osteoarthritis with subchondral cysts.  IMPRESSION: 1. Left hip arthroplasty with no adverse features. 2. Right hip osteoarthritis.   Electronically Signed   By: Odessa Fleming M.D.   On: 08/08/2015 15:14   Dg Hip Operative Unilat With Pelvis Left  08/08/2015   CLINICAL DATA:  Left total hip arthroplasty for osteoarthritis.  EXAM: OPERATIVE LEFT HIP (WITH PELVIS IF PERFORMED) 1 VIEWS  TECHNIQUE: Fluoroscopic spot image(s) were submitted for interpretation post-operatively.  COMPARISON:  Bone window images from CT abdomen and pelvis 9/12/1,014.  FINDINGS: Two spot images were obtained, AP views of the pelvis, demonstrating left total hip arthroplasty with anatomic alignment. No acute complicating features.  The radiologic technologist documented 35 sec of fluoroscopy time.  IMPRESSION: Anatomic alignment post left total hip arthroplasty without acute complicating features.   Electronically Signed   By: Hulan Saas M.D.   On: 08/08/2015 13:56    Disposition:  To skilled nursing       Discharge Instructions    Discharge patient    Complete by:  As directed            Follow-up Information    Follow up with Kathryne Hitch, MD In 2 weeks.   Specialty:  Orthopedic Surgery   Contact information:   [address] [place] [address] [phone]        Signed: Kathryne Hitch 08/11/2015, 6:52 AM   !

## 2015-08-11 NOTE — Clinical Social Work Note (Signed)
Patient to be discharged to [name]. Patient updated at bedside.  Facility: [name] RN report number: [phone] Transportation: EMS  Ariel Dimitri, LCSWA - [phone] Clinical Social Work Department Orthopedics ([id number]) and Surgical ([id number])   

## 2015-08-11 NOTE — Progress Notes (Signed)
Subjective: 3 Days Post-Op Procedure(s) (LRB): LEFT TOTAL HIP ARTHROPLASTY ANTERIOR APPROACH (Left) Patient reports pain as moderate.  H/H stable.  Objective: Vital signs in last 24 hours: Temp:  [98.9 F (37.2 C)-100.2 F (37.9 C)] 100.2 F (37.9 C) (09/09 0610) Pulse Rate:  [108-110] 110 (09/09 0610) Resp:  [18] 18 (09/09 0610) BP: (119-123)/(49-63) 120/63 mmHg (09/09 0610) SpO2:  [98 %] 98 % (09/09 0610)  Intake/Output from previous day: 09/08 0701 - 09/09 0700 In: 360 [P.O.:360] Out: -  Intake/Output this shift:     Recent Labs  08/09/15 0429 08/10/15 0615 08/11/15 0530  HGB 9.9* 9.1* 9.0*    Recent Labs  08/10/15 0615 08/11/15 0530  WBC 8.1 PENDING  RBC 2.91* 2.79*  HCT 27.9* 27.1*  PLT 185 197    Recent Labs  08/09/15 0429  NA 137  K 4.1  CL 101  CO2 26  BUN 20  CREATININE 1.08*  GLUCOSE 173*  CALCIUM 8.2*   No results for input(s): LABPT, INR in the last 72 hours.  Sensation intact distally Intact pulses distally Dorsiflexion/Plantar flexion intact Incision: scant drainage  Assessment/Plan: 3 Days Post-Op Procedure(s) (LRB): LEFT TOTAL HIP ARTHROPLASTY ANTERIOR APPROACH (Left) Up with therapy Discharge to SNF today.  BLACKMAN,CHRISTOPHER Y 08/11/2015, 6:50 AM

## 2015-08-11 NOTE — Clinical Social Work Placement (Signed)
   CLINICAL SOCIAL WORK PLACEMENT  NOTE  Date:  08/11/2015  Patient Details  Name: Amanda Ponce MRN: [id number] Date of Birth: [birth year]  Clinical Social Work is seeking post-discharge placement for this patient at the Skilled  Nursing Facility level of care (*CSW will initial, date and re-position this form in  chart as items are completed):  Yes   Patient/family provided with [facility] Clinical Social Work Department's list of facilities offering this level of care within the geographic area requested by the patient (or if unable, by the patient's family).  Yes   Patient/family informed of their freedom to choose among providers that offer the needed level of care, that participate in Medicare, Medicaid or managed care program needed by the patient, have an available bed and are willing to accept the patient.  Yes   Patient/family informed of [facility]'s ownership interest in [facility] and [facility], as well as of the fact that they are under no obligation to receive care at these facilities.  PASRR submitted to EDS on 08/10/15     PASRR number received on 08/10/15     Existing PASRR number confirmed on  (n/a)     FL2 transmitted to all facilities in geographic area requested by pt/family on 08/10/15     FL2 transmitted to all facilities within larger geographic area on 08/10/15     Patient informed that his/her managed care company has contracts with or will negotiate with certain facilities, including the following:   (yes, [facility])     Yes   Patient/family informed of bed offers received.  Patient chooses bed at [facility]     Physician recommends and patient chooses bed at  (n/a)    Patient to be transferred to [facility] on 08/11/15.  Patient to be transferred to facility by [facility]     Patient family notified on 08/11/15 of transfer.  Name of family member notified:  Patient updated at bedside     PHYSICIAN       Additional Comment:     _______________________________________________ Rod Mae, LCSW 08/11/2015, 12:49 PM

## 2015-08-11 NOTE — Care Management Important Message (Signed)
Important Message  Patient Details  Name: Amanda Ponce MRN: [id number] Date of Birth: [birth year]   Medicare Important Message Given:  Yes-second notification given    Orson Aloe 08/11/2015, 12:00 PM

## 2015-08-11 NOTE — Progress Notes (Signed)
Physical Therapy Treatment Patient Details Name: Amanda Ponce MRN: [id number] DOB: September 01, 1936 Today's Date: 08/11/2015    History of Present Illness Patient is a 79 y/o female s/p left THA. PMH includes HTN.    PT Comments    Patient feeling much better today. Progressing slowly with mobility. Tolerated SPT and short distance ambulation with Min A for balance/safety. Better able to advance RLE during gait training today. Instructed pt in exercises. Some wheezing noted. Plans to d/c to [facility] SNF today. Will follow acutely per current POC.   Follow Up Recommendations  SNF;Supervision/Assistance - 24 hour     Equipment Recommendations  Rolling walker with 5" wheels    Recommendations for Other Services       Precautions / Restrictions Precautions Precautions: Fall Restrictions Weight Bearing Restrictions: Yes LLE Weight Bearing: Weight bearing as tolerated    Mobility  Bed Mobility Overal bed mobility: Needs Assistance Bed Mobility: Supine to Sit     Supine to sit: Min assist;HOB elevated     General bed mobility comments: Assist to elevate trunk and cues for sequencing. Use of rails for support. Increased time.  Transfers Overall transfer level: Needs assistance Equipment used: Rolling walker (2 wheeled) Transfers: Sit to/from Stand Sit to Stand: Min assist;Min guard Stand pivot transfers: Min assist       General transfer comment: Min A to boost from EOB with cues for hand placement and technique. Min A progressing to min guard to stand from [facility]. SPT bed to [facility] with Min A for balance.   Ambulation/Gait Ambulation/Gait assistance: Min guard Ambulation Distance (Feet): 5 Feet (+ 8') Assistive device: Rolling walker (2 wheeled) Gait Pattern/deviations: Step-to pattern;Decreased stance time - left;Decreased step length - right;Antalgic   Gait velocity interpretation: <1.8 ft/sec, indicative of risk for recurrent falls General Gait Details: Slow, unsteady gait. Able  to better advance RLE today. Fatigues. Wheezing noted.    Stairs            Wheelchair Mobility    Modified Rankin (Stroke Patients Only)       Balance Overall balance assessment: Needs assistance Sitting-balance support: Feet supported;Single extremity supported Sitting balance-Leahy Scale: Fair     Standing balance support: During functional activity Standing balance-Leahy Scale: Poor Standing balance comment: Able to stand and perform pericare with Min A for balance.                     Cognition Arousal/Alertness: Awake/alert Behavior During Therapy: WFL for tasks assessed/performed Overall Cognitive Status: Within Functional Limits for tasks assessed                      Exercises Total Joint Exercises Ankle Circles/Pumps: Both;10 reps;Seated Quad Sets: Both;10 reps;Seated Long Arc Quad: Left;10 reps;Seated    General Comments        Pertinent Vitals/Pain Pain Assessment: Faces Faces Pain Scale: Hurts a little bit Pain Location: left hip Pain Descriptors / Indicators: Grimacing;Guarding;Sore Pain Intervention(s): Monitored during session;Premedicated before session;Repositioned    Home Living                      Prior Function            PT Goals (current goals can now be found in the care plan section) Progress towards PT goals: Progressing toward goals    Frequency  7X/week    PT Plan Current plan remains appropriate    Co-evaluation  End of Session Equipment Utilized During Treatment: Gait belt Activity Tolerance: Patient tolerated treatment well;Patient limited by fatigue Patient left: in chair;with call bell/phone within reach     Time: 0902-0922 PT Time Calculation (min) (ACUTE ONLY): 20 min  Charges:  $Therapeutic Activity: 8-22 mins                    G Codes:      Vidalia Serpas A Moe Graca 08/11/2015, 10:49 AM Mylo Red, PT, DPT [phone]

## 2015-08-14 ENCOUNTER — Non-Acute Institutional Stay: Payer: Medicare HMO | Admitting: Adult Health

## 2015-08-14 ENCOUNTER — Encounter: Payer: Self-pay | Admitting: Adult Health

## 2015-08-14 DIAGNOSIS — I10 Essential (primary) hypertension: Secondary | ICD-10-CM

## 2015-08-14 DIAGNOSIS — J309 Allergic rhinitis, unspecified: Secondary | ICD-10-CM | POA: Diagnosis not present

## 2015-08-14 DIAGNOSIS — M1612 Unilateral primary osteoarthritis, left hip: Secondary | ICD-10-CM | POA: Diagnosis not present

## 2015-08-14 DIAGNOSIS — K5900 Constipation, unspecified: Secondary | ICD-10-CM | POA: Diagnosis not present

## 2015-08-14 DIAGNOSIS — K219 Gastro-esophageal reflux disease without esophagitis: Secondary | ICD-10-CM

## 2015-08-14 DIAGNOSIS — Z96649 Presence of unspecified artificial hip joint: Secondary | ICD-10-CM | POA: Diagnosis not present

## 2015-08-14 DIAGNOSIS — Z96642 Presence of left artificial hip joint: Secondary | ICD-10-CM

## 2015-08-14 DIAGNOSIS — D638 Anemia in other chronic diseases classified elsewhere: Secondary | ICD-10-CM | POA: Diagnosis not present

## 2015-08-16 ENCOUNTER — Non-Acute Institutional Stay (SKILLED_NURSING_FACILITY): Payer: Medicare HMO | Admitting: Internal Medicine

## 2015-08-16 DIAGNOSIS — R2681 Unsteadiness on feet: Secondary | ICD-10-CM | POA: Diagnosis not present

## 2015-08-16 DIAGNOSIS — K5900 Constipation, unspecified: Secondary | ICD-10-CM | POA: Diagnosis not present

## 2015-08-16 DIAGNOSIS — D62 Acute posthemorrhagic anemia: Secondary | ICD-10-CM

## 2015-08-16 DIAGNOSIS — I10 Essential (primary) hypertension: Secondary | ICD-10-CM | POA: Diagnosis not present

## 2015-08-16 DIAGNOSIS — M1612 Unilateral primary osteoarthritis, left hip: Secondary | ICD-10-CM

## 2015-08-16 DIAGNOSIS — K219 Gastro-esophageal reflux disease without esophagitis: Secondary | ICD-10-CM

## 2015-08-16 DIAGNOSIS — R600 Localized edema: Secondary | ICD-10-CM

## 2015-08-16 NOTE — Progress Notes (Signed)
Patient ID: Amanda Ponce, female   DOB: 1936/04/11, 79 y.o.   MRN: [id number]     [name]    PCP: [name], MD  Code Status: full code  No Known Allergies  Chief Complaint  Patient presents with  . New Admit To SNF     HPI:  79 y.o. patient is here for short term rehabilitation post hospital admission from 08/08/15-08/11/15 with left hip OA. She underwent left total hip arthroplasty. Her pain is under control with current regimen. She was constipated until yesterday post surgery. Had 1 bowel movement yesterday and twice today. She has noticed increased swelling to her legs. No other concerns.   Review of Systems:  Constitutional: Negative for fever, chills, diaphoresis.  HENT: Negative for headache, congestion, nasal discharge  Respiratory: Negative for cough, shortness of breath and wheezing.   Cardiovascular: Negative for chest pain, palpitations Gastrointestinal: Negative for heartburn, nausea, vomiting, abdominal pain Genitourinary: Negative for dysuria and flank pain.  Musculoskeletal: Negative for back pain, falls Skin: Negative for itching, rash.  Neurological: Negative for dizziness, tingling, focal weakness Psychiatric/Behavioral: Negative for depression    Past Medical History  Diagnosis Date  . Hypertension   . GERD (gastroesophageal reflux disease)   . Headache(784.0)     Sinsus  . Seasonal allergies   . History of hiatal hernia   . Arthritis     OA - L hip  . History of blood transfusion     as a child, transfusion post tonsillectomy- also states that she has areas inher nose that has bleeding tendencies   . 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     Past Surgical History  Procedure Laterality Date  . Colonscopy    . Eye surgery      bil eyes  . Goiter    . Tonsillectomy  1948  . Appendectomy  1966  . Abdominal hysterectomy  2006  . Goiter  2008    Benign  .  Cesarean section      x 2  . Lumbar laminectomy/decompression microdiscectomy  08/19/2012    Procedure: LUMBAR LAMINECTOMY/DECOMPRESSION MICRODISCECTOMY 3 LEVELS;  Surgeon: Hewitt Shorts, MD;  Location: [facility];  Service: Neurosurgery;  Laterality: N/A;  Lumbar two-three, Lumbar three-four,Lumbar four-five laminectomies  . Back surgery    . Total hip arthroplasty Left 08/08/2015    Procedure: LEFT TOTAL HIP ARTHROPLASTY ANTERIOR APPROACH;  Surgeon: Kathryne Hitch, MD;  Location: [facility] OR;  Service: Orthopedics;  Laterality: Left;   Social History:   reports that she quit smoking about 8 months ago. She uses smokeless tobacco. She reports that she does not drink alcohol or use illicit drugs.  No family history on file.  Medications:   Medication List       This list is accurate as of: 08/16/15  4:17 PM.  Always use your most recent med list.               aspirin 325 MG EC tablet  Take 1 tablet (325 mg total) by mouth 2 (two) times daily after a meal.     DEXILANT 60 MG capsule  Generic drug:  dexlansoprazole  Take 60 mg by mouth every other day.     diclofenac 50 MG tablet  Commonly known as:  CATAFLAM  TAKE 1 TABLET BY MOUTH  TWO OR THREE TIMES DAILY WITH FOOD FOR INFLAMMATION  diphenhydrAMINE 25 MG tablet  Commonly known as:  BENADRYL  Take 25 mg by mouth at bedtime.     fluticasone 110 MCG/ACT inhaler  Commonly known as:  FLOVENT HFA  Inhale 2 puffs into the lungs 2 (two) times daily as needed.     GLUCOSAMINE HCL-MSM PO  Take 1 tablet by mouth 2 (two) times daily.     hydrochlorothiazide 25 MG tablet  Commonly known as:  HYDRODIURIL  Take 25 mg by mouth daily.     HYDROcodone-acetaminophen 5-325 MG per tablet  Commonly known as:  NORCO/VICODIN  Take 1 tablet by mouth every 4 (four) hours as needed for moderate pain.     PROBIOTIC ADVANCED PO  Take 1 capsule by mouth 2 (two) times daily.         Physical Exam: Filed Vitals:   08/16/15  1616  BP: 139/60  Pulse: 82  Temp: 97.8 F (36.6 C)  Resp: 18  SpO2: 97%    General- elderly female, obese, in no acute distress Head- normocephalic, atraumatic Throat- moist mucus membrane Eyes- PERRLA, EOMI, no pallor, no icterus, no discharge, normal conjunctiva, normal sclera Neck- no cervical lymphadenopathy Cardiovascular- normal s1,s2, no murmurs,palpable dorsalis pedis and radial pulses, 1+ leg edema Respiratory- bilateral clear to auscultation, no wheeze, no rhonchi, no crackles, no use of accessory muscles Abdomen- bowel sounds present, soft, non tender Musculoskeletal- able to move all 4 extremities, limited left hip range of motion  Neurological- no focal deficit, alert and oriented to person, place and time Skin- warm and dry, left hip surgical incision with staples and healing well Psychiatry- normal mood and affect    Labs reviewed: Basic Metabolic Panel:  Recent Labs  16/10/96 1430 08/09/15 0429  NA 139 137  K 3.3* 4.1  CL 99* 101  CO2 31 26  GLUCOSE 94 173*  BUN 22* 20  CREATININE 1.17* 1.08*  CALCIUM 9.4 8.2*   Liver Function Tests: No results for input(s): AST, ALT, ALKPHOS, BILITOT, PROT, ALBUMIN in the last 8760 hours. No results for input(s): LIPASE, AMYLASE in the last 8760 hours. No results for input(s): AMMONIA in the last 8760 hours. CBC:  Recent Labs  08/09/15 0429 08/10/15 0615 08/11/15 0530  WBC 9.9 8.1 10.0  HGB 9.9* 9.1* 9.0*  HCT 29.9* 27.9* 27.1*  MCV 94.0 95.9 97.1  PLT 233 185 197    Assessment/Plan  Unsteady gait Post hip replacement surgery. Will have her work with physical therapy and occupational therapy team to help with gait training and muscle strengthening exercises.fall precautions. Skin care. Encourage to be out of bed.   Left hip OA S/p surgical reapir with arthroplasty. Continue norco 5-325 mg q4h prn pain and aspirin 325 mg bid for dvt prophylaxis. Has f/u with orthopedics. Continue surigcal incision site  care and to work with therapy team as above.  Blood loss anemia Post op, monitor h&h. D/c diclofenac with her on aspirin as will increase risk of bleed  Leg edema Add ted stockings. Continue hctz for now and add lasix 20 mg daily for 3 days, check bmp  Constipation Change miralax to once a day and continue senna s 2 tab bid for now, hydration encouraged  HTN Stable bp, continue hctz 25 mg daily and monitor bp  gerd Continue dexilant, symptom currently stable   Goals of care: short term rehabilitation   Labs/tests ordered: cbc, bmp  Family/ staff Communication: reviewed care plan with patient and nursing supervisor    Oneal Grout, MD  [place] (Monday-Friday 8 am - 5 pm) [phone] (afterhours)

## 2015-08-25 ENCOUNTER — Non-Acute Institutional Stay: Payer: Medicare HMO | Admitting: Adult Health

## 2015-08-25 DIAGNOSIS — J309 Allergic rhinitis, unspecified: Secondary | ICD-10-CM | POA: Diagnosis not present

## 2015-08-25 DIAGNOSIS — M1612 Unilateral primary osteoarthritis, left hip: Secondary | ICD-10-CM

## 2015-08-25 DIAGNOSIS — K219 Gastro-esophageal reflux disease without esophagitis: Secondary | ICD-10-CM | POA: Diagnosis not present

## 2015-08-25 DIAGNOSIS — Z96649 Presence of unspecified artificial hip joint: Secondary | ICD-10-CM | POA: Diagnosis not present

## 2015-08-25 DIAGNOSIS — K5900 Constipation, unspecified: Secondary | ICD-10-CM

## 2015-08-25 DIAGNOSIS — I10 Essential (primary) hypertension: Secondary | ICD-10-CM

## 2015-08-25 DIAGNOSIS — D638 Anemia in other chronic diseases classified elsewhere: Secondary | ICD-10-CM

## 2015-08-25 DIAGNOSIS — Z96642 Presence of left artificial hip joint: Secondary | ICD-10-CM

## 2015-08-27 ENCOUNTER — Encounter: Payer: Self-pay | Admitting: Adult Health

## 2015-08-27 NOTE — Progress Notes (Signed)
Patient ID: Amanda Ponce, female   DOB: 11/22/1936, 79 y.o.   MRN: [id number]    DATE:  08/25/15 MRN:  [id number]  BIRTHDAY: 1936-11-27  Facility:  Nursing Home Location:  [facility]  Nursing Home Room Number: [id number]  LEVEL OF CARE:  SNF [phone])  Contact Information    Name Relation Home Work Mobile   Dunford,Suzanne Daughter [phone]  [phone]   Richardson,Jami Daughter [phone]     Dia Sitter Daughter [phone]         Chief Complaint  Patient presents with  . Discharge Note    Osteoarthritis S/P left total hip arthroplasty, GERD, allergic rhinitis, hypertension, constipation and anemia    HISTORY OF PRESENT ILLNESS:  79 year old female who is for discharge home with home health PT for endurance and OT for ADLs . DME:  Bedside commode . She has been admitted to [facility] on 08/11/15 from [facility]. She has PMH of hypertension, GERD, seasonal allergies and tuberculosis, has been treated for one year. She has osteoarthritis for which she had left total hip replacement on 9/6.  Patient was admitted to this facility for short-term rehabilitation after the patient's recent hospitalization.  Patient has completed SNF rehabilitation and therapy has cleared the patient for discharge.   PAST MEDICAL HISTORY:  Past Medical History  Diagnosis Date  . Hypertension   . GERD (gastroesophageal reflux disease)   . Headache(784.0)     Sinsus  . Seasonal allergies   . History of hiatal hernia   . Arthritis     OA - L hip  . History of blood transfusion     as a child, transfusion post tonsillectomy- also states that she has areas inher nose that has bleeding tendencies   . 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       CURRENT MEDICATIONS: Reviewed     Medication List       This list is accurate as of: 08/25/15 11:59 PM.  Always use your most recent med list.               aspirin 325 MG  EC tablet  Take 1 tablet (325 mg total) by mouth 2 (two) times daily after a meal.     diphenhydrAMINE 25 MG tablet  Commonly known as:  BENADRYL  Take 25 mg by mouth at bedtime.     fluticasone 110 MCG/ACT inhaler  Commonly known as:  FLOVENT HFA  Inhale 2 puffs into the lungs 2 (two) times daily as needed.     GLUCOSAMINE HCL-MSM PO  Take 1 tablet by mouth 2 (two) times daily.     hydrochlorothiazide 25 MG tablet  Commonly known as:  HYDRODIURIL  Take 25 mg by mouth daily.     HYDROcodone-acetaminophen 5-325 MG per tablet  Commonly known as:  NORCO/VICODIN  Take 1 tablet by mouth every 4 (four) hours as needed for moderate pain.     omeprazole 20 MG capsule  Commonly known as:  PRILOSEC  Take 20 mg by mouth daily.     polyethylene glycol packet  Commonly known as:  MIRALAX / GLYCOLAX  Take 17 g by mouth daily.     PROBIOTIC ADVANCED PO  Take 1 capsule by mouth 2 (two) times daily.          No Known Allergies   REVIEW OF SYSTEMS:  GENERAL: no change in appetite, no fatigue, no  weight changes, no fever, chills or weakness EYES: Denies change in vision, dry eyes, eye pain, itching or discharge EARS: Denies change in hearing, ringing in ears, or earache NOSE: Denies nasal congestion or epistaxis MOUTH and THROAT: Denies oral discomfort, gingival pain or bleeding, pain from teeth or hoarseness   RESPIRATORY: no cough, SOB, DOE, wheezing, hemoptysis CARDIAC: no chest pain, or palpitations GI: no abdominal pain, diarrhea, heart burn, nausea or vomiting GU: Denies dysuria, frequency, hematuria, incontinence, or discharge PSYCHIATRIC: Denies feeling of depression or anxiety. No report of hallucinations, insomnia, paranoia, or agitation   PHYSICAL EXAMINATION  GENERAL APPEARANCE: Well nourished. In no acute distress. Obese SKIN:  Left hip surgical incision is dry, no erythema HEAD: Normal in size and contour. No evidence of trauma EYES: Lids open and close  normally. No blepharitis, entropion or ectropion. PERRL. Conjunctivae are clear and sclerae are white. Lenses are without opacity EARS: Pinnae are normal. Patient hears normal voice tunes of the examiner MOUTH and THROAT: Lips are without lesions. Oral mucosa is moist and without lesions. Tongue is normal in shape, size, and color and without lesions NECK: supple, trachea midline, no neck masses, no thyroid tenderness, no thyromegaly LYMPHATICS: no LAN in the neck, no supraclavicular LAN RESPIRATORY: breathing is even & unlabored, BS CTAB CARDIAC: RRR, no murmur,no extra heart sounds GI: abdomen soft, normal BS, no masses, no tenderness, no hepatomegaly, no splenomegaly EXTREMITIES:  Able to move 4 extremities PSYCHIATRIC: Alert and oriented X 3. Affect and behavior are appropriate  LABS/RADIOLOGY: Labs reviewed: 08/21/15  sodium 141 potassium 3.8 glucose 98 BUN 17 creatinine 0.87 calcium 9.0 08/15/15  sodium 139 potassium 3.8 glucose 109 BUN 15 creatinine 0.84 calcium 8.5 WBC 9.8 hemoglobin 9.4 hematocrit 29.4 MCV 99.0 platelet 313 Basic Metabolic Panel:  Recent Labs  69/62/95 1430 08/09/15 0429  NA 139 137  K 3.3* 4.1  CL 99* 101  CO2 31 26  GLUCOSE 94 173*  BUN 22* 20  CREATININE 1.17* 1.08*  CALCIUM 9.4 8.2*    CBC:  Recent Labs  08/09/15 0429 08/10/15 0615 08/11/15 0530  WBC 9.9 8.1 10.0  HGB 9.9* 9.1* 9.0*  HCT 29.9* 27.9* 27.1*  MCV 94.0 95.9 97.1  PLT 233 185 197     Dg Hip Port Unilat With Pelvis 1v Left  08/08/2015   CLINICAL DATA:  79 year old female status post left total hip arthroplasty. Initial encounter.  EXAM: DG HIP (WITH OR WITHOUT PELVIS) 1V PORT LEFT  COMPARISON:  Intraoperative images from 1326 hours today.  FINDINGS: Portable supine AP and cross-table lateral views of the left hip. Bipolar type hip arthroplasty components appear intact and normally aligned. Surrounding postoperative changes including to the soft tissues the subcutaneous gas and skin  staples. Elsewhere the visualized pelvis and proximal right femur appear intact. There is right hip osteoarthritis with subchondral cysts.  IMPRESSION: 1. Left hip arthroplasty with no adverse features. 2. Right hip osteoarthritis.   Electronically Signed   By: Odessa Fleming M.D.   On: 08/08/2015 15:14   Dg Hip Operative Unilat With Pelvis Left  08/08/2015   CLINICAL DATA:  Left total hip arthroplasty for osteoarthritis.  EXAM: OPERATIVE LEFT HIP (WITH PELVIS IF PERFORMED) 1 VIEWS  TECHNIQUE: Fluoroscopic spot image(s) were submitted for interpretation post-operatively.  COMPARISON:  Bone window images from CT abdomen and pelvis 9/12/1,014.  FINDINGS: Two spot images were obtained, AP views of the pelvis, demonstrating left total hip arthroplasty with anatomic alignment. No acute complicating features.  The  radiologic technologist documented 35 sec of fluoroscopy time.  IMPRESSION: Anatomic alignment post left total hip arthroplasty without acute complicating features.   Electronically Signed   By: Hulan Saas M.D.   On: 08/08/2015 13:56    ASSESSMENT/PLAN:  Osteoarthritis S/P left total hip arthroplasty - for home health PT and OT; continue aspirin 325 mg 1 tab by mouth twice a day and TED hose stockings, thigh-high, on in a.m. and off at at bedtime for DVT prophylaxis; Norco 5/325 mg 1 tab by mouth every 4 hours when necessary for pain; follow-up with Dr. Magnus Ivan, orthopedic surgeon  GERD - continue omeprazole 20 mg 1 capsule by mouth every other day  Allergic rhinitis - continue fluticasone 110 mcg/ACT inhale 2 puffs into the lungs twice a day when necessary  Hypertension - well controlled; continue HCTZ 25 mg 1 tab by mouth daily; check CMP  Anemia of chronic disease - hemoglobin 9.4; improved  Constipation - continue MiraLAX 17 g by mouth daily     I have filled out patient's discharge paperwork and written prescriptions.  Patient will receive home health PTand OT.  DME provided:   Bedside commode  Total discharge time: Greater than 30 minutes  Discharge time involved coordination of the discharge process with social worker, nursing staff and therapy department. Medical justification for home health services/DME verified.   [facility], NP [employer] [phone]

## 2015-08-27 NOTE — Progress Notes (Addendum)
Patient ID: Amanda Ponce, female   DOB: 10-19-1936, 79 y.o.   MRN: [id number]    DATE:  08/14/15 MRN:  [id number]  BIRTHDAY: [birth year]  Facility:  Nursing Home Location:  [name]  Nursing Home Room Number: [id number]  LEVEL OF CARE:  SNF [phone])  Contact Information    Name Relation Home Work Mobile   Dunford,Suzanne Daughter [phone]  [phone]   Richardson,Jami Daughter [phone]     Dia Sitter Daughter [phone]         Chief Complaint  Patient presents with  . Hospitalization Follow-up    Osteoarthritis S/P left total hip arthroplasty, GERD, allergic rhinitis, hypertension and anemia    HISTORY OF PRESENT ILLNESS:  79 year old female who has been admitted to [facility] on 08/11/15 from [facility]. She has PMH of hypertension, GERD, seasonal allergies and tuberculosis, has been treated for one year. She has osteoarthritis for which she had left total hip replacement on 9/6.  She has been admitted for a short-term rehabilitation.   PAST MEDICAL HISTORY:  Past Medical History  Diagnosis Date  . Hypertension   . GERD (gastroesophageal reflux disease)   . Headache(784.0)     Sinsus  . Seasonal allergies   . History of hiatal hernia   . Arthritis     OA - L hip  . History of blood transfusion     as a child, transfusion post tonsillectomy- also states that she has areas inher nose that has bleeding tendencies   . 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       CURRENT MEDICATIONS: Reviewed  Patient's Medications  New Prescriptions   No medications on file  Previous Medications   ASPIRIN EC 325 MG EC TABLET    Take 1 tablet (325 mg total) by mouth 2 (two) times daily after a meal.   DEXLANSOPRAZOLE (DEXILANT) 60 MG CAPSULE    Take 60 mg by mouth every other day.    DICLOFENAC (CATAFLAM) 50 MG TABLET    TAKE 1 TABLET BY MOUTH  TWO OR THREE TIMES DAILY WITH FOOD FOR INFLAMMATION   DIPHENHYDRAMINE (BENADRYL) 25 MG TABLET    Take 25 mg by mouth at bedtime.   FLUTICASONE (FLOVENT HFA) 110 MCG/ACT INHALER    Inhale 2 puffs into the lungs 2 (two) times daily as needed.   GLUCOSAMINE HCL-MSM PO    Take 1 tablet by mouth 2 (two) times daily.   HYDROCHLOROTHIAZIDE (HYDRODIURIL) 25 MG TABLET    Take 25 mg by mouth daily.   HYDROCODONE-ACETAMINOPHEN (NORCO/VICODIN) 5-325 MG PER TABLET    Take 1 tablet by mouth every 4 (four) hours as needed for moderate pain.   PROBIOTIC PRODUCT (PROBIOTIC ADVANCED PO)    Take 1 capsule by mouth 2 (two) times daily.  Modified Medications   No medications on file  Discontinued Medications   No medications on file     No Known Allergies   REVIEW OF SYSTEMS:  GENERAL: no change in appetite, no fatigue, no weight changes, no fever, chills or weakness EYES: Denies change in vision, dry eyes, eye pain, itching or discharge EARS: Denies change in hearing, ringing in ears, or earache NOSE: Denies nasal congestion or epistaxis MOUTH and THROAT: Denies oral discomfort, gingival pain or bleeding, pain from teeth or hoarseness   RESPIRATORY: no cough, SOB, DOE, wheezing, hemoptysis CARDIAC: no chest pain, or palpitations GI: no abdominal pain, diarrhea,  heart burn, nausea or vomiting, +constipation GU: Denies dysuria, frequency, hematuria, incontinence, or discharge PSYCHIATRIC: Denies feeling of depression or anxiety. No report of hallucinations, insomnia, paranoia, or agitation   PHYSICAL EXAMINATION  GENERAL APPEARANCE: Well nourished. In no acute distress. Obese SKIN:  Left hip with staples and dry dressing, no erythema HEAD: Normal in size and contour. No evidence of trauma EYES: Lids open and close normally. No blepharitis, entropion or ectropion. PERRL. Conjunctivae are clear and sclerae are white. Lenses are without opacity EARS: Pinnae are normal. Patient hears normal voice tunes of the examiner MOUTH and THROAT: Lips are without  lesions. Oral mucosa is moist and without lesions. Tongue is normal in shape, size, and color and without lesions NECK: supple, trachea midline, no neck masses, no thyroid tenderness, no thyromegaly LYMPHATICS: no LAN in the neck, no supraclavicular LAN RESPIRATORY: breathing is even & unlabored, BS CTAB CARDIAC: RRR, no murmur,no extra heart sounds, BLE edema 1-2+ GI: abdomen soft, normal BS, no masses, no tenderness, no hepatomegaly, no splenomegaly EXTREMITIES:  Able to move 4 extremities PSYCHIATRIC: Alert and oriented X 3. Affect and behavior are appropriate  LABS/RADIOLOGY: Labs reviewed: Basic Metabolic Panel:  Recent Labs  69/62/95 1430 08/09/15 0429  NA 139 137  K 3.3* 4.1  CL 99* 101  CO2 31 26  GLUCOSE 94 173*  BUN 22* 20  CREATININE 1.17* 1.08*  CALCIUM 9.4 8.2*    CBC:  Recent Labs  08/09/15 0429 08/10/15 0615 08/11/15 0530  WBC 9.9 8.1 10.0  HGB 9.9* 9.1* 9.0*  HCT 29.9* 27.9* 27.1*  MCV 94.0 95.9 97.1  PLT 233 185 197     Dg Hip Port Unilat With Pelvis 1v Left  08/08/2015   CLINICAL DATA:  79 year old female status post left total hip arthroplasty. Initial encounter.  EXAM: DG HIP (WITH OR WITHOUT PELVIS) 1V PORT LEFT  COMPARISON:  Intraoperative images from 1326 hours today.  FINDINGS: Portable supine AP and cross-table lateral views of the left hip. Bipolar type hip arthroplasty components appear intact and normally aligned. Surrounding postoperative changes including to the soft tissues the subcutaneous gas and skin staples. Elsewhere the visualized pelvis and proximal right femur appear intact. There is right hip osteoarthritis with subchondral cysts.  IMPRESSION: 1. Left hip arthroplasty with no adverse features. 2. Right hip osteoarthritis.   Electronically Signed   By: Odessa Fleming M.D.   On: 08/08/2015 15:14   Dg Hip Operative Unilat With Pelvis Left  08/08/2015   CLINICAL DATA:  Left total hip arthroplasty for osteoarthritis.  EXAM: OPERATIVE LEFT HIP  (WITH PELVIS IF PERFORMED) 1 VIEWS  TECHNIQUE: Fluoroscopic spot image(s) were submitted for interpretation post-operatively.  COMPARISON:  Bone window images from CT abdomen and pelvis 9/12/1,014.  FINDINGS: Two spot images were obtained, AP views of the pelvis, demonstrating left total hip arthroplasty with anatomic alignment. No acute complicating features.  The radiologic technologist documented 35 sec of fluoroscopy time.  IMPRESSION: Anatomic alignment post left total hip arthroplasty without acute complicating features.   Electronically Signed   By: Hulan Saas M.D.   On: 08/08/2015 13:56    ASSESSMENT/PLAN:  Osteoarthritis S/P left total hip arthroplasty - for rehabilitation; continue aspirin 325 mg 1 tab by mouth twice a day and start TED hose stockings, thigh-high, on in a.m. and off at at bedtime for DVT prophylaxis; Norco 5/325 mg 1 tab by mouth every 4 hours when necessary for pain; follow-up with Dr. Magnus Ivan, orthopedic surgeon, in 2 weeks  GERD - continue DEXA lent 60 mg 1 capsule by mouth every other day  Allergic rhinitis - continue fluticasone 110 mcg/ACT inhale 2 puffs into the lungs twice a day when necessary  Hypertension - well controlled; continue HCTZ 25 mg 1 tab by mouth daily; check CMP  Anemia of chronic disease - hemoglobin 9.0; check CBC  Constipation - start MiraLAX 17 g by mouth twice a day    Goals of care:  Short-term rehabilitation   [facility], NP [facility] [phone]

## 2015-08-29 DIAGNOSIS — I10 Essential (primary) hypertension: Secondary | ICD-10-CM | POA: Diagnosis not present

## 2015-08-29 DIAGNOSIS — M6281 Muscle weakness (generalized): Secondary | ICD-10-CM | POA: Diagnosis not present

## 2015-08-29 DIAGNOSIS — Z471 Aftercare following joint replacement surgery: Secondary | ICD-10-CM | POA: Diagnosis not present

## 2015-08-29 DIAGNOSIS — Z96642 Presence of left artificial hip joint: Secondary | ICD-10-CM | POA: Diagnosis not present

## 2015-09-11 ENCOUNTER — Telehealth: Payer: Self-pay | Admitting: *Deleted

## 2015-09-11 NOTE — Telephone Encounter (Signed)
Amanda Ponce called requesting a Rx for a RF for Hydrocodone. Patient has been discharged from facility. Explained to her that she would have to get her refills from her Primary Dr. [name] become a new patient with our practice. She understood and will call her Primary Provider.

## 2016-07-02 ENCOUNTER — Other Ambulatory Visit: Payer: Self-pay | Admitting: Physician Assistant

## 2016-07-04 ENCOUNTER — Encounter (HOSPITAL_COMMUNITY): Payer: Self-pay

## 2016-07-04 ENCOUNTER — Encounter (HOSPITAL_COMMUNITY)
Admission: RE | Admit: 2016-07-04 | Discharge: 2016-07-04 | Disposition: A | Discharge status: Home / Self Care | Payer: Medicare HMO | Source: Ambulatory Visit | Attending: Orthopaedic Surgery | Admitting: Orthopaedic Surgery

## 2016-07-04 DIAGNOSIS — M1611 Unilateral primary osteoarthritis, right hip: Secondary | ICD-10-CM | POA: Diagnosis not present

## 2016-07-04 DIAGNOSIS — Z01812 Encounter for preprocedural laboratory examination: Secondary | ICD-10-CM | POA: Insufficient documentation

## 2016-07-04 HISTORY — DX: Depression, unspecified: F32.A

## 2016-07-04 HISTORY — DX: Localized edema: R60.0

## 2016-07-04 HISTORY — DX: Cardiac murmur, unspecified: R01.1

## 2016-07-04 HISTORY — DX: Major depressive disorder, single episode, unspecified: F32.9

## 2016-07-04 LAB — SURGICAL PCR SCREEN
MRSA, PCR: NEGATIVE
STAPHYLOCOCCUS AUREUS: NEGATIVE

## 2016-07-04 LAB — CBC
HEMATOCRIT: 37.1 % (ref 36.0–46.0)
HEMOGLOBIN: 12.4 g/dL (ref 12.0–15.0)
MCH: 31.6 pg (ref 26.0–34.0)
MCHC: 33.4 g/dL (ref 30.0–36.0)
MCV: 94.4 fL (ref 78.0–100.0)
Platelets: 255 10*3/uL (ref 150–400)
RBC: 3.93 MIL/uL (ref 3.87–5.11)
RDW: 13.3 % (ref 11.5–15.5)
WBC: 8 10*3/uL (ref 4.0–10.5)

## 2016-07-04 LAB — BASIC METABOLIC PANEL
Anion gap: 10 (ref 5–15)
BUN: 22 mg/dL — ABNORMAL HIGH (ref 6–20)
CALCIUM: 9.2 mg/dL (ref 8.9–10.3)
CHLORIDE: 97 mmol/L — AB (ref 101–111)
CO2: 31 mmol/L (ref 22–32)
CREATININE: 1.11 mg/dL — AB (ref 0.44–1.00)
GFR calc Af Amer: 54 mL/min — ABNORMAL LOW (ref >60)
GFR calc non Af Amer: 46 mL/min — ABNORMAL LOW (ref >60)
GLUCOSE: 84 mg/dL (ref 65–99)
Potassium: 4 mmol/L (ref 3.5–5.1)
Sodium: 138 mmol/L (ref 135–145)

## 2016-07-04 LAB — ABO/RH: ABO/RH(D): O NEG

## 2016-07-04 NOTE — Patient Instructions (Signed)
Amanda Ponce  07/04/2016   Your procedure is scheduled on: 07/11/16  Report to [facility] Main  Entrance take [facility]  elevators to 3rd floor to  Short Stay Center at 5:30 AM.  Call this number if you have problems the morning of surgery [phone]   Remember: ONLY 1 PERSON MAY GO WITH YOU TO SHORT STAY TO GET  READY MORNING OF YOUR SURGERY.  Do not eat food or drink liquids :After Midnight WED     Take these medicines the morning of surgery with A SIP OF WATER: Prozac                                You may not have any metal on your body including hair pins and              piercings  Do not wear jewelry, make-up, lotions, powders or perfumes, deodorant             Do not wear nail polish.  Do not shave  48 hours prior to surgery.              Men may shave face and neck.   Do not bring valuables to the hospital. [facility] IS NOT             RESPONSIBLE   FOR VALUABLES.  Contacts, dentures or bridgework may not be worn into surgery.  Leave suitcase in the car. After surgery it may be brought to your room.           _____________________________________________________________________             [facility] - Preparing for Surgery Before surgery, you can play an important role.  Because skin is not sterile, your skin needs to be as free of germs as possible.  You can reduce the number of germs on your skin by washing with CHG (chlorahexidine gluconate) soap before surgery.  CHG is an antiseptic cleaner which kills germs and bonds with the skin to continue killing germs even after washing. Please DO NOT use if you have an allergy to CHG or antibacterial soaps.  If your skin becomes reddened/irritated stop using the CHG and inform your nurse when you arrive at Short Stay. Do not shave (including legs and underarms) for at least 48 hours prior to the first CHG shower.  You may shave your face/neck. Please follow these instructions carefully:  1.  Shower  with CHG Soap the night before surgery and the  morning of Surgery.  2.  If you choose to wash your hair, wash your hair first as usual with your  normal  shampoo.  3.  After you shampoo, rinse your hair and body thoroughly to remove the  shampoo.                           4.  Use CHG as you would any other liquid soap.  You can apply chg directly  to the skin and wash                       Gently with a scrungie or clean washcloth.  5.  Apply the CHG Soap to your body ONLY FROM THE NECK DOWN.   Do not use on face/ open  Wound or open sores. Avoid contact with eyes, ears mouth and genitals (private parts).                       Wash face,  Genitals (private parts) with your normal soap.             6.  Wash thoroughly, paying special attention to the area where your surgery  will be performed.  7.  Thoroughly rinse your body with warm water from the neck down.  8.  DO NOT shower/wash with your normal soap after using and rinsing off  the CHG Soap.                9.  Pat yourself dry with a clean towel.            10.  Wear clean pajamas.            11.  Place clean sheets on your bed the night of your first shower and do not  sleep with pets. Day of Surgery : Do not apply any lotions/deodorants the morning of surgery.  Please wear clean clothes to the hospital/surgery center.  FAILURE TO FOLLOW THESE INSTRUCTIONS MAY RESULT IN THE CANCELLATION OF YOUR SURGERY PATIENT SIGNATURE_________________________________  NURSE SIGNATURE__________________________________  ________________________________________________________________________

## 2016-07-04 NOTE — Pre-Procedure Instructions (Signed)
Patient stated she had an EKG in June 2017 at [facility], and it showed "something" so she was referred to  cardiologist, Dr. Leeann Must in [place], [place]. EKG obtained and is on chart. EKG shows RSR (VI) , "Probably Normal".  Pt has appt with Dr. Leeann Must on 07/05/16 to get clearance for surgery. ECHO report is available in EPIC under "CARE EVERYWHERE" tab.

## 2016-07-05 NOTE — Progress Notes (Signed)
07/05/2016- PRE-OPERATIVE CLEARANCE FROM DR. RENALDO ON CHART AND EKG FROM 05/27/2016 ON CHART.

## 2016-07-10 NOTE — Anesthesia Preprocedure Evaluation (Signed)
Anesthesia Evaluation  Patient identified by MRN, date of birth, ID band Patient awake    Reviewed: Allergy & Precautions, NPO status , Patient's Chart, lab work & pertinent test results  Airway Mallampati: II  TM Distance: >3 FB Neck ROM: Full    Dental no notable dental hx.    Pulmonary Current Smoker,  >50 pk yr hx   Pulmonary exam normal breath sounds clear to auscultation       Cardiovascular hypertension, Normal cardiovascular exam Rhythm:Regular Rate:Normal     Neuro/Psych negative neurological ROS  negative psych ROS   GI/Hepatic negative GI ROS, Neg liver ROS,   Endo/Other  negative endocrine ROS  Renal/GU negative Renal ROS  negative genitourinary   Musculoskeletal negative musculoskeletal ROS (+)   Abdominal   Peds negative pediatric ROS (+)  Hematology negative hematology ROS (+)   Anesthesia Other Findings   Reproductive/Obstetrics negative OB ROS                             Anesthesia Physical Anesthesia Plan  ASA: III  Anesthesia Plan: General   Post-op Pain Management:    Induction: Intravenous  Airway Management Planned: Oral ETT  Additional Equipment:   Intra-op Plan:   Post-operative Plan: Extubation in OR  Informed Consent: I have reviewed the patients History and Physical, chart, labs and discussed the procedure including the risks, benefits and alternatives for the proposed anesthesia with the patient or authorized representative who has indicated his/her understanding and acceptance.   Dental advisory given  Plan Discussed with: CRNA and Surgeon  Anesthesia Plan Comments:         Anesthesia Quick Evaluation

## 2016-07-11 ENCOUNTER — Inpatient Hospital Stay (HOSPITAL_COMMUNITY)
Admission: RE | Admit: 2016-07-11 | Discharge: 2016-07-13 | DRG: 470 | Disposition: A | Discharge status: Skilled Nursing Facility | Payer: Medicare HMO | Source: Ambulatory Visit | Attending: Orthopaedic Surgery | Admitting: Orthopaedic Surgery

## 2016-07-11 ENCOUNTER — Inpatient Hospital Stay (HOSPITAL_COMMUNITY): Payer: Medicare HMO

## 2016-07-11 ENCOUNTER — Inpatient Hospital Stay (HOSPITAL_COMMUNITY): Payer: Medicare HMO | Admitting: Anesthesiology

## 2016-07-11 ENCOUNTER — Encounter (HOSPITAL_COMMUNITY): Payer: Self-pay

## 2016-07-11 ENCOUNTER — Encounter (HOSPITAL_COMMUNITY)
Admission: RE | Disposition: A | Discharge status: Skilled Nursing Facility | Payer: Self-pay | Source: Ambulatory Visit | Attending: Orthopaedic Surgery

## 2016-07-11 DIAGNOSIS — F329 Major depressive disorder, single episode, unspecified: Secondary | ICD-10-CM | POA: Diagnosis present

## 2016-07-11 DIAGNOSIS — Z87891 Personal history of nicotine dependence: Secondary | ICD-10-CM

## 2016-07-11 DIAGNOSIS — K219 Gastro-esophageal reflux disease without esophagitis: Secondary | ICD-10-CM | POA: Diagnosis present

## 2016-07-11 DIAGNOSIS — Z8611 Personal history of tuberculosis: Secondary | ICD-10-CM

## 2016-07-11 DIAGNOSIS — Z96641 Presence of right artificial hip joint: Secondary | ICD-10-CM

## 2016-07-11 DIAGNOSIS — M25551 Pain in right hip: Secondary | ICD-10-CM

## 2016-07-11 DIAGNOSIS — J302 Other seasonal allergic rhinitis: Secondary | ICD-10-CM | POA: Diagnosis present

## 2016-07-11 DIAGNOSIS — M1611 Unilateral primary osteoarthritis, right hip: Principal | ICD-10-CM

## 2016-07-11 DIAGNOSIS — E876 Hypokalemia: Secondary | ICD-10-CM | POA: Diagnosis present

## 2016-07-11 DIAGNOSIS — I10 Essential (primary) hypertension: Secondary | ICD-10-CM | POA: Diagnosis present

## 2016-07-11 DIAGNOSIS — D62 Acute posthemorrhagic anemia: Secondary | ICD-10-CM | POA: Diagnosis not present

## 2016-07-11 HISTORY — PX: TOTAL HIP ARTHROPLASTY: SHX124

## 2016-07-11 LAB — TYPE AND SCREEN
ABO/RH(D): O NEG
ANTIBODY SCREEN: NEGATIVE

## 2016-07-11 SURGERY — ARTHROPLASTY, HIP, TOTAL, ANTERIOR APPROACH
Anesthesia: General | Site: Hip | Laterality: Right

## 2016-07-11 MED ORDER — FENTANYL CITRATE (PF) 100 MCG/2ML IJ SOLN
INTRAMUSCULAR | Status: DC | PRN
  Administered 2016-07-11 (×2): 50 ug via INTRAVENOUS
  Administered 2016-07-11 (×2): 100 ug via INTRAVENOUS
  Administered 2016-07-11: 50 ug via INTRAVENOUS

## 2016-07-11 MED ORDER — SODIUM CHLORIDE 0.9 % IR SOLN
Status: DC | PRN
  Administered 2016-07-11: 1000 mL

## 2016-07-11 MED ORDER — ROCURONIUM BROMIDE 100 MG/10ML IV SOLN
INTRAVENOUS | Status: DC | PRN
  Administered 2016-07-11: 10 mg via INTRAVENOUS
  Administered 2016-07-11: 40 mg via INTRAVENOUS

## 2016-07-11 MED ORDER — TRANEXAMIC ACID 1000 MG/10ML IV SOLN
1000.0000 mg | INTRAVENOUS | Status: AC
  Administered 2016-07-11: 1000 mg via INTRAVENOUS
  Filled 2016-07-11: qty 10

## 2016-07-11 MED ORDER — ACETAMINOPHEN 325 MG PO TABS
650.0000 mg | ORAL_TABLET | Freq: Four times a day (QID) | ORAL | Status: DC | PRN
  Administered 2016-07-12: 650 mg via ORAL
  Filled 2016-07-11: qty 2

## 2016-07-11 MED ORDER — LACTATED RINGERS IV SOLN: INTRAVENOUS | Status: DC

## 2016-07-11 MED ORDER — ONDANSETRON HCL 4 MG PO TABS: 4.0000 mg | ORAL_TABLET | Freq: Four times a day (QID) | ORAL | Status: DC | PRN

## 2016-07-11 MED ORDER — ONDANSETRON HCL 4 MG/2ML IJ SOLN
4.0000 mg | Freq: Four times a day (QID) | INTRAMUSCULAR | Status: DC | PRN
  Administered 2016-07-11 – 2016-07-12 (×2): 4 mg via INTRAVENOUS
  Filled 2016-07-11 (×2): qty 2

## 2016-07-11 MED ORDER — PHENOL 1.4 % MT LIQD: 1.0000 | OROMUCOSAL | Status: DC | PRN

## 2016-07-11 MED ORDER — LACTATED RINGERS IV SOLN
INTRAVENOUS | Status: DC | PRN
  Administered 2016-07-11 (×2): via INTRAVENOUS

## 2016-07-11 MED ORDER — PROPOFOL 10 MG/ML IV BOLUS
INTRAVENOUS | Status: AC
  Filled 2016-07-11: qty 20

## 2016-07-11 MED ORDER — MENTHOL 3 MG MT LOZG: 1.0000 | LOZENGE | OROMUCOSAL | Status: DC | PRN

## 2016-07-11 MED ORDER — HYDROMORPHONE HCL 1 MG/ML IJ SOLN
0.2500 mg | INTRAMUSCULAR | Status: DC | PRN
  Administered 2016-07-11 (×2): 0.5 mg via INTRAVENOUS

## 2016-07-11 MED ORDER — FENTANYL CITRATE (PF) 100 MCG/2ML IJ SOLN
INTRAMUSCULAR | Status: AC
  Filled 2016-07-11: qty 2

## 2016-07-11 MED ORDER — SUGAMMADEX SODIUM 200 MG/2ML IV SOLN
INTRAVENOUS | Status: DC | PRN
  Administered 2016-07-11: 200 mg via INTRAVENOUS

## 2016-07-11 MED ORDER — ACETAMINOPHEN 650 MG RE SUPP: 650.0000 mg | Freq: Four times a day (QID) | RECTAL | Status: DC | PRN

## 2016-07-11 MED ORDER — HYDROMORPHONE HCL 1 MG/ML IJ SOLN
INTRAMUSCULAR | Status: AC
  Administered 2016-07-11: 1 mg
  Filled 2016-07-11: qty 1

## 2016-07-11 MED ORDER — ASPIRIN EC 325 MG PO TBEC
325.0000 mg | DELAYED_RELEASE_TABLET | Freq: Two times a day (BID) | ORAL | Status: DC
  Administered 2016-07-11 – 2016-07-13 (×4): 325 mg via ORAL
  Filled 2016-07-11 (×4): qty 1

## 2016-07-11 MED ORDER — PROPOFOL 10 MG/ML IV BOLUS
INTRAVENOUS | Status: DC | PRN
  Administered 2016-07-11: 140 mg via INTRAVENOUS

## 2016-07-11 MED ORDER — ALBUTEROL SULFATE HFA 108 (90 BASE) MCG/ACT IN AERS
INHALATION_SPRAY | RESPIRATORY_TRACT | Status: AC
  Filled 2016-07-11: qty 6.7

## 2016-07-11 MED ORDER — CEFAZOLIN SODIUM-DEXTROSE 2-4 GM/100ML-% IV SOLN
INTRAVENOUS | Status: AC
  Filled 2016-07-11: qty 100

## 2016-07-11 MED ORDER — HYDROCODONE-ACETAMINOPHEN 5-325 MG PO TABS
1.0000 | ORAL_TABLET | ORAL | Status: DC | PRN
  Administered 2016-07-11 (×3): 1 via ORAL
  Administered 2016-07-12: 2 via ORAL
  Administered 2016-07-12 – 2016-07-13 (×3): 1 via ORAL
  Filled 2016-07-11: qty 2
  Filled 2016-07-11 (×6): qty 1

## 2016-07-11 MED ORDER — FUROSEMIDE 20 MG PO TABS
20.0000 mg | ORAL_TABLET | Freq: Every day | ORAL | Status: DC
  Administered 2016-07-11 – 2016-07-13 (×3): 20 mg via ORAL
  Filled 2016-07-11 (×5): qty 1

## 2016-07-11 MED ORDER — POLYETHYLENE GLYCOL 3350 17 G PO PACK: 17.0000 g | PACK | Freq: Every day | ORAL | Status: DC | PRN

## 2016-07-11 MED ORDER — EPHEDRINE SULFATE 50 MG/ML IJ SOLN
INTRAMUSCULAR | Status: DC | PRN
  Administered 2016-07-11: 10 mg via INTRAVENOUS
  Administered 2016-07-11: 15 mg via INTRAVENOUS

## 2016-07-11 MED ORDER — CHLORHEXIDINE GLUCONATE 4 % EX LIQD: 60.0000 mL | Freq: Once | CUTANEOUS | Status: DC

## 2016-07-11 MED ORDER — METHOCARBAMOL 1000 MG/10ML IJ SOLN
500.0000 mg | Freq: Four times a day (QID) | INTRAVENOUS | Status: DC | PRN
  Administered 2016-07-11: 500 mg via INTRAVENOUS
  Filled 2016-07-11: qty 550
  Filled 2016-07-11: qty 5

## 2016-07-11 MED ORDER — CEFAZOLIN IN D5W 1 GM/50ML IV SOLN
1.0000 g | Freq: Four times a day (QID) | INTRAVENOUS | Status: AC
  Administered 2016-07-11 (×2): 1 g via INTRAVENOUS
  Filled 2016-07-11 (×2): qty 50

## 2016-07-11 MED ORDER — METHOCARBAMOL 500 MG PO TABS
500.0000 mg | ORAL_TABLET | Freq: Four times a day (QID) | ORAL | Status: DC | PRN
  Administered 2016-07-11 – 2016-07-12 (×3): 500 mg via ORAL
  Filled 2016-07-11 (×4): qty 1

## 2016-07-11 MED ORDER — CEFAZOLIN SODIUM-DEXTROSE 2-4 GM/100ML-% IV SOLN
2.0000 g | INTRAVENOUS | Status: AC
  Administered 2016-07-11: 2 g via INTRAVENOUS

## 2016-07-11 MED ORDER — OXYCODONE HCL 5 MG PO TABS
5.0000 mg | ORAL_TABLET | ORAL | Status: DC | PRN
  Administered 2016-07-11: 5 mg via ORAL
  Filled 2016-07-11: qty 1

## 2016-07-11 MED ORDER — ONDANSETRON HCL 4 MG/2ML IJ SOLN
INTRAMUSCULAR | Status: DC | PRN
  Administered 2016-07-11: 4 mg via INTRAVENOUS

## 2016-07-11 MED ORDER — HYDROMORPHONE HCL 1 MG/ML IJ SOLN
1.0000 mg | INTRAMUSCULAR | Status: DC | PRN
  Administered 2016-07-11: 1 mg via INTRAVENOUS
  Filled 2016-07-11 (×2): qty 1

## 2016-07-11 MED ORDER — DIPHENHYDRAMINE HCL 12.5 MG/5ML PO ELIX: 12.5000 mg | ORAL_SOLUTION | ORAL | Status: DC | PRN

## 2016-07-11 MED ORDER — METOCLOPRAMIDE HCL 5 MG/ML IJ SOLN: 5.0000 mg | Freq: Three times a day (TID) | INTRAMUSCULAR | Status: DC | PRN

## 2016-07-11 MED ORDER — POTASSIUM CHLORIDE CRYS ER 10 MEQ PO TBCR
10.0000 meq | EXTENDED_RELEASE_TABLET | Freq: Every day | ORAL | Status: DC
  Administered 2016-07-11 – 2016-07-13 (×3): 10 meq via ORAL
  Filled 2016-07-11 (×5): qty 1

## 2016-07-11 MED ORDER — LIDOCAINE HCL (CARDIAC) 20 MG/ML IV SOLN
INTRAVENOUS | Status: DC | PRN
  Administered 2016-07-11: 50 mg via INTRAVENOUS

## 2016-07-11 MED ORDER — FLUOXETINE HCL 20 MG PO CAPS
20.0000 mg | ORAL_CAPSULE | Freq: Every day | ORAL | Status: DC
  Administered 2016-07-11 – 2016-07-13 (×3): 20 mg via ORAL
  Filled 2016-07-11 (×3): qty 1

## 2016-07-11 MED ORDER — PROMETHAZINE HCL 25 MG/ML IJ SOLN: 6.2500 mg | INTRAMUSCULAR | Status: DC | PRN

## 2016-07-11 MED ORDER — HYDROCHLOROTHIAZIDE 25 MG PO TABS
25.0000 mg | ORAL_TABLET | Freq: Every day | ORAL | Status: DC
  Administered 2016-07-11 – 2016-07-12 (×2): 25 mg via ORAL
  Filled 2016-07-11 (×3): qty 1

## 2016-07-11 MED ORDER — SUGAMMADEX SODIUM 200 MG/2ML IV SOLN
INTRAVENOUS | Status: AC
  Filled 2016-07-11: qty 2

## 2016-07-11 MED ORDER — METOCLOPRAMIDE HCL 5 MG PO TABS
5.0000 mg | ORAL_TABLET | Freq: Three times a day (TID) | ORAL | Status: DC | PRN
  Administered 2016-07-12: 10 mg via ORAL
  Filled 2016-07-11: qty 2

## 2016-07-11 MED ORDER — DOCUSATE SODIUM 100 MG PO CAPS
100.0000 mg | ORAL_CAPSULE | Freq: Two times a day (BID) | ORAL | Status: DC
  Administered 2016-07-11 – 2016-07-13 (×4): 100 mg via ORAL
  Filled 2016-07-11 (×4): qty 1

## 2016-07-11 MED ORDER — ALUM & MAG HYDROXIDE-SIMETH 200-200-20 MG/5ML PO SUSP: 30.0000 mL | ORAL | Status: DC | PRN

## 2016-07-11 MED ORDER — SODIUM CHLORIDE 0.9 % IV SOLN
INTRAVENOUS | Status: DC
  Administered 2016-07-11: 12:00:00 via INTRAVENOUS

## 2016-07-11 MED ORDER — ALBUTEROL SULFATE HFA 108 (90 BASE) MCG/ACT IN AERS
INHALATION_SPRAY | RESPIRATORY_TRACT | Status: DC | PRN
  Administered 2016-07-11: 5 via RESPIRATORY_TRACT

## 2016-07-11 MED ORDER — FENTANYL CITRATE (PF) 250 MCG/5ML IJ SOLN
INTRAMUSCULAR | Status: AC
  Filled 2016-07-11: qty 5

## 2016-07-11 SURGICAL SUPPLY — 34 items
BAG ZIPLOCK 12X15 (MISCELLANEOUS) IMPLANT
BENZOIN TINCTURE PRP APPL 2/3 (GAUZE/BANDAGES/DRESSINGS) IMPLANT
BLADE SAW SGTL 18X1.27X75 (BLADE) ×2 IMPLANT
CAPT HIP TOTAL 2 ×2 IMPLANT
CELLS DAT CNTRL 66122 CELL SVR (MISCELLANEOUS) ×1 IMPLANT
CLOTH BEACON ORANGE TIMEOUT ST (SAFETY) ×2 IMPLANT
DRAPE STERI IOBAN 125X83 (DRAPES) ×2 IMPLANT
DRAPE U-SHAPE 47X51 STRL (DRAPES) ×4 IMPLANT
DRSG AQUACEL AG ADV 3.5X10 (GAUZE/BANDAGES/DRESSINGS) ×2 IMPLANT
DURAPREP 26ML APPLICATOR (WOUND CARE) ×2 IMPLANT
ELECT REM PT RETURN 9FT ADLT (ELECTROSURGICAL) ×2
ELECTRODE REM PT RTRN 9FT ADLT (ELECTROSURGICAL) ×1 IMPLANT
GAUZE XEROFORM 1X8 LF (GAUZE/BANDAGES/DRESSINGS) ×4 IMPLANT
GLOVE BIO SURGEON STRL SZ7.5 (GLOVE) ×2 IMPLANT
GLOVE BIOGEL PI IND STRL 8 (GLOVE) ×2 IMPLANT
GLOVE BIOGEL PI INDICATOR 8 (GLOVE) ×2
GLOVE ECLIPSE 8.0 STRL XLNG CF (GLOVE) ×2 IMPLANT
GOWN STRL REUS W/TWL XL LVL3 (GOWN DISPOSABLE) ×4 IMPLANT
HANDPIECE INTERPULSE COAX TIP (DISPOSABLE) ×1
HOLDER FOLEY CATH W/STRAP (MISCELLANEOUS) ×2 IMPLANT
PACK ANTERIOR HIP CUSTOM (KITS) ×2 IMPLANT
RTRCTR WOUND ALEXIS 18CM MED (MISCELLANEOUS) ×2
SET HNDPC FAN SPRY TIP SCT (DISPOSABLE) ×1 IMPLANT
STAPLER VISISTAT 35W (STAPLE) ×2 IMPLANT
STRIP CLOSURE SKIN 1/2X4 (GAUZE/BANDAGES/DRESSINGS) IMPLANT
SUT ETHIBOND NAB CT1 #1 30IN (SUTURE) ×2 IMPLANT
SUT MNCRL AB 4-0 PS2 18 (SUTURE) IMPLANT
SUT VIC AB 0 CT1 36 (SUTURE) ×2 IMPLANT
SUT VIC AB 1 CT1 36 (SUTURE) ×2 IMPLANT
SUT VIC AB 2-0 CT1 27 (SUTURE) ×2
SUT VIC AB 2-0 CT1 TAPERPNT 27 (SUTURE) ×2 IMPLANT
TRAY FOLEY W/METER SILVER 14FR (SET/KITS/TRAYS/PACK) ×2 IMPLANT
TRAY FOLEY W/METER SILVER 16FR (SET/KITS/TRAYS/PACK) IMPLANT
YANKAUER SUCT BULB TIP NO VENT (SUCTIONS) ×2 IMPLANT

## 2016-07-11 NOTE — Op Note (Signed)
NAME:  Amanda Ponce, FEDIE NO.:  [id number]  MEDICAL RECORD NO.:  [id number]  LOCATION:  [facility]                         FACILITY:  [facility]  PHYSICIAN:  Vanita Panda. Magnus Ivan, M.D.DATE OF BIRTH:  04/24/1936  DATE OF PROCEDURE:  07/11/2016 DATE OF DISCHARGE:                              OPERATIVE REPORT   PREOPERATIVE DIAGNOSIS:  Primary osteoarthritis and degenerative joint disease, right hip.  POSTOPERATIVE DIAGNOSIS:  Primary osteoarthritis and degenerative joint disease, right hip.  PROCEDURE:  Right total hip arthroplasty through direct anterior approach.  IMPLANTS:  DePuy Sector Gription acetabular component size 50, size 32+ 0 neutral polyethylene liner, size 12 Corail femoral component with standard offset, size 32+ 5 ceramic hip ball.  SURGEON:  Vanita Panda. Magnus Ivan, M.D.  ASSISTANT:  Richardean Canal, PA-C.  ANESTHESIA:  General.  ANTIBIOTICS:  2 g IV Ancef.  BLOOD LOSS:  250 to 300 mL.  COMPLICATIONS:  None.  INDICATIONS:  This is a 80 year old female who is well known to me.  She has debilitating arthritis of both of her hips and in September of last year underwent a successful left total hip arthroplasty through direct anterior approach.  Her right hip has been bothering her quite a bit. Her pain is daily, and it is detrimentally affecting her activities of daily living, her quality of life and her mobility.  At this point, she has tried and failed all forms of conservative treatment and does wish to proceed with a total hip arthroplasty on the right side.  She understands the risk of acute blood loss anemia, nerve and vessel injury, fracture, infection, dislocation, DVT.  She understands our goals are decreased pain, improved mobility, and overall improved quality of life.  PROCEDURE DESCRIPTION:  After informed consent was obtained, appropriate right hip was marked.  She was brought to the operating room, and general anesthesia was  obtained while she was on her stretcher.  A Foley catheter was placed.  Then, both feet had traction boots applied to them.  Next, she was placed supine on the Hana fracture table with the perineal post in place and both legs in inline skeletal traction devices, but no traction applied.  Her right operative hip was then prepped and draped with DuraPrep and sterile drapes.  A time-out was called to identify correct patient, correct right hip.  I then made an incision inferior and posterior to the anterior superior iliac spine and carried this obliquely down the leg.  We dissected down the tensor fascia lata muscle, and tensor fascia was then divided longitudinally, so we could proceed with a direct anterior approach to the hip.  We identified and cauterized the circumflex vessels and then identified the hip capsule.  I opened up the hip capsule in an L-type format finding a moderate joint effusion and significant periarticular osteophytes around the hip.  We placed Cobra retractors within the joint capsule and then made our femoral neck cut with an oscillating saw proximal to the lesser trochanter and completed this with an osteotome.  I placed a corkscrew guide in the femoral head and removed the femoral head in its entirety and found it to be devoid of cartilage.  I  then cleaned the acetabular remnants of the acetabular labrum.  I placed a bent Hohmann over the medial acetabular rim and began reaming under direct visualization from a size 42 reamer up to a size 50 with all reamers under direct visualization and the last reamer also under direct fluoroscopy so that I could obtain my depth of reaming, our inclination, and anteversion. Once we were pleased with this, we placed a real DePuy Sector Gription acetabular component size 50 and a 32+ 0 neutral polyethylene liner. Attention was then turned to the femur.  With the leg externally rotated to 120 degrees, extended and adducted, we were  able to place a Mueller retractor medially and a Hohmann retractor behind the greater trochanter.  We released the lateral joint capsule used a box cutting osteotome to enter the femoral canal and a rongeur to lateralize.  We then began broaching from a size 8 broach using the Corail broaching system to go up to a size 12.  With a size 12 in place, we trialed a standard offset femoral neck and a 32+ 1 hip ball.  We rolled the leg back over and up with traction and internal rotation reducing the pelvis, and it was definitely stable, but I felt like we could increase her leg length and offset a little bit.  We dislocated the hip and removed the trial components.  I then placed the real Corail femoral component size 12 without difficulty and then the real 32+ 5 hip ball, rolled back over and up reducing the pelvis.  We then irrigated the soft tissue with normal saline solution using pulsatile lavage.  We closed the joint capsule with interrupted #1 Ethibond suture, followed by running #1 Vicryl in the tensor fascia, 0 Vicryl in deep tissue, 2-0 Vicryl in the subcutaneous tissue, interrupted staples on the skin. Xeroform and Aquacel dressing was applied.  She was taken off the Hana table, awakened, extubated, and taken to the recovery room in stable condition.  All final counts were correct.  There were no complications noted.  Of note, Richardean CanalGilbert Clark, PA-C assisted throughout the entire case.  His assistance was crucial for facilitating all aspects of this case.     Vanita Pandahristopher Y. Magnus IvanBlackman, M.D.     CYB/MEDQ  D:  07/11/2016  T:  07/11/2016  Job:  [id number]

## 2016-07-11 NOTE — Transfer of Care (Signed)
Immediate Anesthesia Transfer of Care Note  Patient: Amanda Ponce  Procedure(s) Performed: Procedure(s): RIGHT TOTAL HIP ARTHROPLASTY ANTERIOR APPROACH (Right)  Patient Location: PACU  Anesthesia Type:General  Level of Consciousness: awake, alert  and oriented  Airway & Oxygen Therapy: Patient Spontanous Breathing and Patient connected to face mask oxygen  Post-op Assessment: Report given to RN and Post -op Vital signs reviewed and stable  Post vital signs: Reviewed and stable  Last Vitals:  Vitals:   07/11/16 0556  BP: 122/60  Pulse: 77  Resp: 16  Temp: 36.7 C    Last Pain:  Vitals:   07/11/16 0619  TempSrc:   PainSc: 3          Complications: No apparent anesthesia complications

## 2016-07-11 NOTE — Anesthesia Procedure Notes (Signed)
Procedure Name: Intubation Performed by: Hosie Sharman J Pre-anesthesia Checklist: Patient identified, Emergency Drugs available, Suction available, Patient being monitored and Timeout performed Patient Re-evaluated:Patient Re-evaluated prior to inductionOxygen Delivery Method: Circle system utilized Preoxygenation: Pre-oxygenation with 100% oxygen Intubation Type: IV induction Ventilation: Mask ventilation without difficulty Laryngoscope Size: Mac and 4 Grade View: Grade I Tube type: Oral Tube size: 7.0 mm Number of attempts: 1 Airway Equipment and Method: Stylet Placement Confirmation: ETT inserted through vocal cords under direct vision,  positive ETCO2,  CO2 detector and breath sounds checked- equal and bilateral Secured at: 21 cm Tube secured with: Tape Dental Injury: Teeth and Oropharynx as per pre-operative assessment        

## 2016-07-11 NOTE — H&P (Signed)
TOTAL HIP ADMISSION H&P  Patient is admitted for right total hip arthroplasty.  Subjective:  Chief Complaint: right hip pain  HPI: Amanda Ponce, 80 y.o. female, has a history of pain and functional disability in the right hip(s) due to arthritis and patient has failed non-surgical conservative treatments for greater than 12 weeks to include NSAID's and/or analgesics, corticosteriod injections, flexibility and strengthening excercises, supervised PT with diminished ADL's post treatment, use of assistive devices, weight reduction as appropriate and activity modification.  Onset of symptoms was gradual starting 3 years ago with gradually worsening course since that time.The patient noted no past surgery on the right hip(s).  Patient currently rates pain in the right hip at 10 out of 10 with activity. Patient has night pain, worsening of pain with activity and weight bearing, trendelenberg gait, pain that interfers with activities of daily living and pain with passive range of motion. Patient has evidence of subchondral cysts, subchondral sclerosis, periarticular osteophytes and joint space narrowing by imaging studies. This condition presents safety issues increasing the risk of falls.  There is no current active infection.  Patient Active Problem List   Diagnosis Date Noted  . Osteoarthritis of right hip 07/11/2016  . Osteoarthritis of left hip 08/08/2015  . Status post total replacement of left hip 08/08/2015   Past Medical History:  Diagnosis Date  . Arthritis    OA - L hip  . Depression    situational- concerns about her weight and health  . Edema of both legs    ankles swell- pt attributes edema to her weight  . GERD (gastroesophageal reflux disease)   . Headache(784.0)    Sinsus  . Heart murmur   . History of blood transfusion    as a child, transfusion post tonsillectomy- also states that she has areas inher nose that has bleeding tendencies   . History of hiatal hernia   .  Hypertension   . Seasonal allergies   . 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      Past Surgical History:  Procedure Laterality Date  . ABDOMINAL HYSTERECTOMY  2006  . APPENDECTOMY  1966  . BACK SURGERY    . CESAREAN SECTION     x 2  . Colonscopy    . EYE SURGERY     bil eyes  . Goiter    . Goiter  2008   Benign  . LUMBAR LAMINECTOMY/DECOMPRESSION MICRODISCECTOMY  08/19/2012   Procedure: LUMBAR LAMINECTOMY/DECOMPRESSION MICRODISCECTOMY 3 LEVELS;  Surgeon: Hewitt Shortsobert W Nudelman, MD;  Location: [facility];  Service: Neurosurgery;  Laterality: N/A;  Lumbar two-three, Lumbar three-four,Lumbar four-five laminectomies  . TONSILLECTOMY  1948  . TOTAL HIP ARTHROPLASTY Left 08/08/2015   Procedure: LEFT TOTAL HIP ARTHROPLASTY ANTERIOR APPROACH;  Surgeon: Kathryne Hitchhristopher Y Shalon Councilman, MD;  Location: [facility] OR;  Service: Orthopedics;  Laterality: Left;    Prescriptions Prior to Admission  Medication Sig Dispense Refill Last Dose  . diphenhydrAMINE (BENADRYL) 25 mg capsule Take 25 mg by mouth at bedtime.   07/10/2016 at pm  . FLUoxetine (PROZAC) 20 MG capsule Take 1 capsule by mouth daily.   07/10/2016 at am  . furosemide (LASIX) 20 MG tablet Take 1 tablet by mouth daily.   07/10/2016 at am  . hydrochlorothiazide (HYDRODIURIL) 25 MG tablet Take 25 mg by mouth daily.   07/10/2016 at am  . Naproxen Sodium (ALEVE PO) Take 1 capsule by mouth daily as needed (pain).  07/10/2016 at 2100  . potassium chloride (K-DUR) 10 MEQ tablet Take 1 tablet by mouth daily.   07/10/2016 at am   No Known Allergies  Social History  Substance Use Topics  . Smoking status: Former Smoker    Packs/day: 1.00    Years: 56.00    Types: E-cigarettes    Quit date: 11/25/2014  . Smokeless tobacco: Never Used     Comment: pt. states she "VAPES"- but my breathing has improved.   . Alcohol use No    History reviewed. No pertinent family history.   Review of Systems  Musculoskeletal: Positive for joint  pain.  All other systems reviewed and are negative.   Objective:  Physical Exam  Constitutional: She is oriented to person, place, and time. She appears well-developed and well-nourished.  HENT:  Head: Normocephalic and atraumatic.  Eyes: EOM are normal. Pupils are equal, round, and reactive to light.  Neck: Normal range of motion. Neck supple.  Cardiovascular: Normal rate and regular rhythm.   Respiratory: Effort normal and breath sounds normal.  GI: Soft. Bowel sounds are normal.  Musculoskeletal:       Right hip: She exhibits decreased range of motion, decreased strength, tenderness and bony tenderness.  Neurological: She is alert and oriented to person, place, and time.  Skin: Skin is warm and dry.  Psychiatric: She has a normal mood and affect.    Vital signs in last 24 hours: Temp:  [98 F (36.7 C)] 98 F (36.7 C) (08/10 0556) Pulse Rate:  [77] 77 (08/10 0556) Resp:  [16] 16 (08/10 0556) BP: (122)/(60) 122/60 (08/10 0556) SpO2:  [95 %] 95 % (08/10 0556) Weight:  [90.9 kg (200 lb 6 oz)] 90.9 kg (200 lb 6 oz) (08/10 1610)  Labs:   Estimated body mass index is 34.39 kg/m as calculated from the following:   Height as of this encounter:  (1.626 m).   Weight as of this encounter: 90.9 kg (200 lb 6 oz).   Imaging Review Plain radiographs demonstrate severe degenerative joint disease of the right hip(s). The bone quality appears to be good for age and reported activity level.  Assessment/Plan:  End stage arthritis, right hip(s)  The patient history, physical examination, clinical judgement of the provider and imaging studies are consistent with end stage degenerative joint disease of the right hip(s) and total hip arthroplasty is deemed medically necessary. The treatment options including medical management, injection therapy, arthroscopy and arthroplasty were discussed at length. The risks and benefits of total hip arthroplasty were presented and reviewed. The  risks due to aseptic loosening, infection, stiffness, dislocation/subluxation,  thromboembolic complications and other imponderables were discussed.  The patient acknowledged the explanation, agreed to proceed with the plan and consent was signed. Patient is being admitted for inpatient treatment for surgery, pain control, PT, OT, prophylactic antibiotics, VTE prophylaxis, progressive ambulation and ADL's and discharge planning.The patient is planning to be discharged to skilled nursing facility

## 2016-07-11 NOTE — Brief Op Note (Signed)
07/11/2016  8:47 AM  PATIENT:  Amanda Ponce  80 y.o. female  PRE-OPERATIVE DIAGNOSIS:  Endstage osteoarthritis right hip  POST-OPERATIVE DIAGNOSIS:  Endstage osteoarthritis right hip  PROCEDURE:  Procedure(s): RIGHT TOTAL HIP ARTHROPLASTY ANTERIOR APPROACH (Right)  SURGEON:  Surgeon(s) and Role:    * Kathryne Hitchhristopher Y Clemmie Buelna, MD - Primary  PHYSICIAN ASSISTANT: Rexene EdisonGil Clark, PA-C  ANESTHESIA:   general  EBL:  Total I/O In: 1000 [I.V.:1000] Out: 250 [Blood:250]  COUNTS:  YES  DICTATION: .Other Dictation: Dictation Number [id number]  PLAN OF CARE: Admit to inpatient   PATIENT DISPOSITION:  PACU - hemodynamically stable.   Delay start of Pharmacological VTE agent (>24hrs) due to surgical blood loss or risk of bleeding: no

## 2016-07-11 NOTE — Anesthesia Postprocedure Evaluation (Signed)
Anesthesia Post Note  Patient: Amanda Ponce  Procedure(s) Performed: Procedure(s) (LRB): RIGHT TOTAL HIP ARTHROPLASTY ANTERIOR APPROACH (Right)  Patient location during evaluation: PACU Anesthesia Type: General Level of consciousness: awake and alert Pain management: pain level controlled Vital Signs Assessment: post-procedure vital signs reviewed and stable Respiratory status: spontaneous breathing, nonlabored ventilation, respiratory function stable and patient connected to nasal cannula oxygen Cardiovascular status: blood pressure returned to baseline and stable Postop Assessment: no signs of nausea or vomiting Anesthetic complications: no    Last Vitals:  Vitals:   07/11/16 1220 07/11/16 1330  BP: 124/65 117/66  Pulse: 90 92  Resp: 16 18  Temp: 36.3 C 37 C    Last Pain:  Vitals:   07/11/16 1330  TempSrc: Oral  PainSc:                  Aelyn Stanaland S

## 2016-07-11 NOTE — Evaluation (Signed)
Physical Therapy Evaluation Patient Details Name: QUENTINA FRONEK MRN: [id number] DOB: Jul 25, 1936 Today's Date: 07/11/2016   History of Present Illness  Pt s/p R THR with hx of L THR (16) and lumbar laminectomy (13)  Clinical Impression  Pt s/p R THR presents with decreased R LE strength/ROM and post op pain limiting functional mobility.  Pt would benefit from follow up rehab at SNF level to maximize IND and safety prior to return home with ltd assist.    Follow Up Recommendations SNF    Equipment Recommendations  None recommended by PT    Recommendations for Other Services OT consult     Precautions / Restrictions Precautions Precautions: Fall Restrictions Weight Bearing Restrictions: No Other Position/Activity Restrictions: WBAT      Mobility  Bed Mobility Overal bed mobility: Needs Assistance;+2 for physical assistance;+ 2 for safety/equipment Bed Mobility: Supine to Sit;Sit to Supine     Supine to sit: Min assist;Mod assist;+2 for physical assistance;+2 for safety/equipment Sit to supine: Mod assist;+2 for physical assistance;+2 for safety/equipment   General bed mobility comments: cues for sequence and use of L LE to self assist  Transfers Overall transfer level: Needs assistance Equipment used: Rolling walker (2 wheeled) Transfers: Sit to/from Stand Sit to Stand: Min assist;Mod assist;+2 safety/equipment         General transfer comment: cues for LE management and use of UEs to self assist  Ambulation/Gait Ambulation/Gait assistance: Min assist;Mod assist;+2 safety/equipment Ambulation Distance (Feet): 5 Feet Assistive device: Rolling walker (2 wheeled) Gait Pattern/deviations: Step-to pattern Gait velocity: decr Gait velocity interpretation: Below normal speed for age/gender General Gait Details: Pt side-stepped up to top of bed - ltd by increasing nausea  Stairs            Wheelchair Mobility    Modified Rankin (Stroke Patients Only)        Balance                                             Pertinent Vitals/Pain Pain Assessment: 0-10 Pain Score: 6  Pain Location: R hip Pain Descriptors / Indicators: Aching;Sore Pain Intervention(s): Limited activity within patient's tolerance;Monitored during session;Premedicated before session;Ice applied    Home Living Family/patient expects to be discharged to:: Skilled nursing facility Living Arrangements: Alone   Type of Home: House Home Access: Stairs to enter Entrance Stairs-Rails: Right Entrance Stairs-Number of Steps: 6 Home Layout: One level Home Equipment: Environmental consultant - 2 wheels      Prior Function Level of Independence: Independent with assistive device(s)         Comments: Used cane or RW to assist as needed     Hand Dominance   Dominant Hand: Right    Extremity/Trunk Assessment   Upper Extremity Assessment: Overall WFL for tasks assessed           Lower Extremity Assessment: RLE deficits/detail      Cervical / Trunk Assessment: Normal  Communication   Communication: No difficulties  Cognition Arousal/Alertness: Awake/alert Behavior During Therapy: WFL for tasks assessed/performed Overall Cognitive Status: Within Functional Limits for tasks assessed                      General Comments      Exercises Total Joint Exercises Ankle Circles/Pumps: AROM;Both;15 reps;Supine      Assessment/Plan    PT Assessment Patient needs continued  PT services  PT Diagnosis Difficulty walking   PT Problem List Decreased strength;Decreased range of motion;Decreased activity tolerance;Decreased mobility;Decreased balance;Decreased knowledge of use of DME;Pain;Obesity  PT Treatment Interventions DME instruction;Gait training;Functional mobility training;Therapeutic activities;Therapeutic exercise;Patient/family education   PT Goals (Current goals can be found in the Care Plan section) Acute Rehab PT Goals Patient Stated Goal:  Regain IND at short term rehab prior to return home with ltd assist PT Goal Formulation: With patient Time For Goal Achievement: 07/18/16 Potential to Achieve Goals: Good    Frequency 7X/week   Barriers to discharge        Co-evaluation               End of Session Equipment Utilized During Treatment: Gait belt Activity Tolerance: Patient tolerated treatment well;Other (comment) (N&V) Patient left: in bed;with call bell/phone within reach;with family/visitor present Nurse Communication: Mobility status         Time: [id number] PT Time Calculation (min) (ACUTE ONLY): 34 min   Charges:   PT Evaluation $PT Eval Low Complexity: 1 Procedure PT Treatments $Therapeutic Activity: 8-22 mins   PT G Codes:        Armella Stogner,[place] 07/11/2016, 5:00 PM

## 2016-07-12 LAB — CBC
HCT: 28.3 % — ABNORMAL LOW (ref 36.0–46.0)
Hemoglobin: 9.5 g/dL — ABNORMAL LOW (ref 12.0–15.0)
MCH: 32.4 pg (ref 26.0–34.0)
MCHC: 33.6 g/dL (ref 30.0–36.0)
MCV: 96.6 fL (ref 78.0–100.0)
PLATELETS: 209 10*3/uL (ref 150–400)
RBC: 2.93 MIL/uL — ABNORMAL LOW (ref 3.87–5.11)
RDW: 13.8 % (ref 11.5–15.5)
WBC: 9.4 10*3/uL (ref 4.0–10.5)

## 2016-07-12 LAB — BASIC METABOLIC PANEL
ANION GAP: 7 (ref 5–15)
BUN: 13 mg/dL (ref 6–20)
CALCIUM: 8.2 mg/dL — AB (ref 8.9–10.3)
CO2: 33 mmol/L — ABNORMAL HIGH (ref 22–32)
CREATININE: 0.97 mg/dL (ref 0.44–1.00)
Chloride: 98 mmol/L — ABNORMAL LOW (ref 101–111)
GFR calc Af Amer: >60 mL/min (ref >60)
GFR, EST NON AFRICAN AMERICAN: 55 mL/min — AB (ref >60)
GLUCOSE: 133 mg/dL — AB (ref 65–99)
Potassium: 3.3 mmol/L — ABNORMAL LOW (ref 3.5–5.1)
Sodium: 138 mmol/L (ref 135–145)

## 2016-07-12 MED ORDER — POTASSIUM CHLORIDE CRYS ER 20 MEQ PO TBCR
40.0000 meq | EXTENDED_RELEASE_TABLET | Freq: Once | ORAL | Status: AC
  Administered 2016-07-12: 40 meq via ORAL
  Filled 2016-07-12: qty 2

## 2016-07-12 NOTE — Progress Notes (Signed)
Physical Therapy Treatment Patient Details Name: Amanda BenderCarol A Ponce MRN: [id number] DOB: May 08, 1936 Today's Date: 07/12/2016    History of Present Illness Pt s/p R THR with hx of L THR (16) and lumbar laminectomy (13)    PT Comments    Pt limited this pm by fatigue and recurring nausea but agreeable to bed therex.  Follow Up Recommendations  SNF     Equipment Recommendations  None recommended by PT    Recommendations for Other Services OT consult     Precautions / Restrictions Precautions Precautions: Fall Restrictions Weight Bearing Restrictions: No Other Position/Activity Restrictions: WBAT    Mobility  Bed Mobility                  Transfers                    Ambulation/Gait                 Stairs            Wheelchair Mobility    Modified Rankin (Stroke Patients Only)       Balance                                    Cognition Arousal/Alertness: Awake/alert Behavior During Therapy: WFL for tasks assessed/performed Overall Cognitive Status: Within Functional Limits for tasks assessed                      Exercises Total Joint Exercises Ankle Circles/Pumps: AROM;Both;15 reps;Supine Quad Sets: AROM;Both;10 reps;Supine Heel Slides: AAROM;Right;Supine;20 reps Hip ABduction/ADduction: AAROM;Right;Supine;15 reps    General Comments        Pertinent Vitals/Pain Pain Assessment: 0-10 Pain Score: 4  Pain Location: R hip Pain Descriptors / Indicators: Aching;Sore Pain Intervention(s): Limited activity within patient's tolerance;Monitored during session;Premedicated before session;Ice applied    Home Living                      Prior Function            PT Goals (current goals can now be found in the care plan section) Acute Rehab PT Goals Patient Stated Goal: Regain IND at short term rehab prior to return home with ltd assist PT Goal Formulation: With patient Time For Goal Achievement:  07/18/16 Potential to Achieve Goals: Good Progress towards PT goals: Progressing toward goals    Frequency  7X/week    PT Plan Current plan remains appropriate    Co-evaluation             End of Session Equipment Utilized During Treatment: Gait belt Activity Tolerance: Patient limited by fatigue;Patient limited by pain;Other (comment) Patient left: in bed;with call bell/phone within reach;with family/visitor present     Time: [id number] PT Time Calculation (min) (ACUTE ONLY): 21 min  Charges:  $Therapeutic Exercise: 8-22 mins                    G Codes:      Victor Langenbach,[place] 07/12/2016, 5:24 PM

## 2016-07-12 NOTE — Progress Notes (Signed)
Physical Therapy Treatment Patient Details Name: Amanda BenderCarol A Ureste MRN: [id number] DOB: September 22, 1936 Today's Date: 07/12/2016    History of Present Illness Pt s/p R THR with hx of L THR (16) and lumbar laminectomy (13)    PT Comments    Pt cooperative but progressing slowly with mobility 2* pain, fatigue and nausea.  Follow Up Recommendations  SNF     Equipment Recommendations  None recommended by PT    Recommendations for Other Services OT consult     Precautions / Restrictions Precautions Precautions: Fall Restrictions Weight Bearing Restrictions: No Other Position/Activity Restrictions: WBAT    Mobility  Bed Mobility Overal bed mobility: Needs Assistance;+2 for physical assistance;+ 2 for safety/equipment Bed Mobility: Supine to Sit     Supine to sit: Min assist;Mod assist;+2 for physical assistance;+2 for safety/equipment     General bed mobility comments: cues for sequence and use of L LE to self assist  Transfers Overall transfer level: Needs assistance Equipment used: Rolling walker (2 wheeled) Transfers: Sit to/from Stand Sit to Stand: Min assist;Mod assist;+2 safety/equipment         General transfer comment: cues for LE management and use of UEs to self assist  Ambulation/Gait Ambulation/Gait assistance: Min assist;Mod assist;+2 physical assistance;+2 safety/equipment Ambulation Distance (Feet): 4 Feet Assistive device: Rolling walker (2 wheeled) Gait Pattern/deviations: Step-to pattern;Decreased step length - right;Shuffle;Decreased stance time - left Gait velocity: decr Gait velocity interpretation: Below normal speed for age/gender General Gait Details: Cues for sequence, posture and position from RW. Physical assist for balance/support and to advance R LE   Stairs            Wheelchair Mobility    Modified Rankin (Stroke Patients Only)       Balance                                    Cognition Arousal/Alertness:  Awake/alert Behavior During Therapy: WFL for tasks assessed/performed Overall Cognitive Status: Within Functional Limits for tasks assessed                      Exercises Total Joint Exercises Ankle Circles/Pumps: AROM;Both;15 reps;Supine Quad Sets: AROM;Both;10 reps;Supine Heel Slides: AAROM;Right;15 reps;Supine Hip ABduction/ADduction: AAROM;Right;10 reps;Supine    General Comments        Pertinent Vitals/Pain Pain Assessment: 0-10 Pain Score: 5  Pain Location: R hip Pain Descriptors / Indicators: Aching;Sore Pain Intervention(s): Limited activity within patient's tolerance;Monitored during session;Premedicated before session;Ice applied    Home Living                      Prior Function            PT Goals (current goals can now be found in the care plan section) Acute Rehab PT Goals Patient Stated Goal: Regain IND at short term rehab prior to return home with ltd assist PT Goal Formulation: With patient Time For Goal Achievement: 07/18/16 Potential to Achieve Goals: Good Progress towards PT goals: Progressing toward goals    Frequency  7X/week    PT Plan Current plan remains appropriate    Co-evaluation PT/OT/SLP Co-Evaluation/Treatment: Yes Reason for Co-Treatment: For patient/therapist safety PT goals addressed during session: Mobility/safety with mobility OT goals addressed during session: ADL's and self-care     End of Session Equipment Utilized During Treatment: Gait belt Activity Tolerance: Patient limited by fatigue;Patient limited by pain;Other (comment) (nausea)  Patient left: in chair;with call bell/phone within reach;with chair alarm set     Time: [phone] PT Time Calculation (min) (ACUTE ONLY): 38 min  Charges:  $Gait Training: 8-22 mins $Therapeutic Exercise: 8-22 mins                    G Codes:      Eleen Litz,[place] 08/10/2016, 11:05 AM

## 2016-07-12 NOTE — Clinical Social Work Note (Signed)
Clinical Social Work Assessment  Patient Details  Name: Amanda Ponce MRN: [id number] Date of Birth: 07/09/1936  Date of referral:  07/12/16               Reason for consult:  Facility Placement, Discharge Planning                Permission sought to share information with:  Chartered certified accountant granted to share information::  Yes, Verbal Permission Granted  Name::        Agency::     Relationship::     Contact Information:     Housing/Transportation Living arrangements for the past 2 months:  Single Family Home Source of Information:  Patient Patient Interpreter Needed:  None Criminal Activity/Legal Involvement Pertinent to Current Situation/Hospitalization:  No - Comment as needed Significant Relationships:  Adult Children Lives with:  Self Do you feel safe going back to the place where you live?  No Need for family participation in patient care:  No (Coment)  Care giving concerns:  Pt's care cannot be managed at home following hospital d/c.   Social Worker assessment / plan:  Pt hospitalized from home on 07/11/16 for pre planned right total hip arthroplasty. PT has recommended SNF at d/c. CSW met with pt to assist with d/c planning. Pt is in agreement with ST Rehab and has requested [place] [facility]. Clinicals have been sent to [facility] for review. Pt has [employer] which requires prior authorization for SNF. CSW has requested authorization from [facility] and a decision is pending.  CSW will continue to follow to assist with d/c planning to SNF.  Employment status:  Retired Nurse, adult PT Recommendations:  [place] / Referral to community resources:  [place]  Patient/Family's Response to care:  Pt agrees with plan for ST Rehab at d/c.  Patient/Family's Understanding of and Emotional Response to Diagnosis, Current Treatment, and Prognosis:  Pt is aware of her medical status. " I'm  Tired and sore. I didn't get much sleep last. " Pt is looking forward to having ST Rehab at [facility] at d/c. " I had my rehab at [facility] last time. I'd like to go back. " Pt is motivated to work with therapy. Emotional Assessment Appearance:  Appears stated age Attitude/Demeanor/Rapport:   (cooperative) Affect (typically observed):  Pleasant, Appropriate Orientation:  Oriented to Self, Oriented to Place, Oriented to  Time, Oriented to Situation Alcohol / Substance use:  Not Applicable Psych involvement (Current and /or in the community):  No (Comment)  Discharge Needs  Concerns to be addressed:  Discharge Planning Concerns Readmission within the last 30 days:  No Current discharge risk:  None Barriers to Discharge:  No Barriers Identified   Amanda Ponce, [place] 07/12/2016, 10:03 AM

## 2016-07-12 NOTE — Clinical Social Work Placement (Signed)
   CLINICAL SOCIAL WORK PLACEMENT  NOTE  Date:  07/12/2016  Patient Details  Name: Amanda BenderCarol A Jantz MRN: [id number] Date of Birth: 03-04-1936  Clinical Social Work is seeking post-discharge placement for this patient at the Skilled  Nursing Facility level of care (*CSW will initial, date and re-position this form in  chart as items are completed):  Yes   Patient/family provided with [place] Clinical Social Work Department's list of facilities offering this level of care within the geographic area requested by the patient (or if unable, by the patient's family).  Yes   Patient/family informed of their freedom to choose among providers that offer the needed level of care, that participate in Medicare, Medicaid or managed care program needed by the patient, have an available bed and are willing to accept the patient.  Yes   Patient/family informed of [facility]'s ownership interest [facility] and [facility], as well as of the fact that they are under no obligation to receive care at these facilities.  PASRR submitted to EDS on       PASRR number received on       Existing PASRR number confirmed on 07/12/16     FL2 transmitted to all facilities in geographic area requested by pt/family on 07/12/16     FL2 transmitted to all facilities within larger geographic area on       Patient informed that his/her managed care company has contracts with or will negotiate with certain facilities, including the following:            Patient/family informed of bed offers received.  Patient chooses bed at       Physician recommends and patient chooses bed at      Patient to be transferred to   on  .  Patient to be transferred to facility by       Patient family notified on   of transfer.  Name of family member notified:        PHYSICIAN       Additional Comment:    _______________________________________________ Royetta AsalHaidinger, Chay Mazzoni Lee, LCSW  [phone] 07/12/2016, 10:13  AM

## 2016-07-12 NOTE — Progress Notes (Signed)
Occupational Therapy Evaluation Patient Details Name: Amanda Ponce MRN: [id number] DOB: [birth year] Today's Date: 07/12/2016    History of Present Illness Pt s/p R THR with hx of L THR (16) and lumbar laminectomy (13)   Clinical Impression   Patient presents to OT with decreased ADL independence and safety due to the deficits listed below. All further OT needs can be met at [facility]. Will sign off.    Follow Up Recommendations  SNF    Equipment Recommendations  Other (comment) (tbd next venue of care)    Recommendations for Other Services       Precautions / Restrictions Precautions Precautions: Fall Restrictions Weight Bearing Restrictions: No Other Position/Activity Restrictions: WBAT      Mobility Bed Mobility Overal bed mobility: Needs Assistance;+2 for physical assistance;+ 2 for safety/equipment Bed Mobility: Supine to Sit     Supine to sit: Min assist;Mod assist;+2 for physical assistance;+2 for safety/equipment     General bed mobility comments: cues for sequence and use of L LE to self assist  Transfers Overall transfer level: Needs assistance Equipment used: Rolling walker (2 wheeled) Transfers: Sit to/from [employer] Sit to Stand: Min assist;Mod assist;+2 safety/equipment         General transfer comment: cues for LE management and use of UEs to self assist    Balance                                            ADL Overall ADL's : Needs assistance/impaired Eating/Feeding: Set up;Sitting   Grooming: Wash/dry hands;Wash/dry face;Set up;Sitting           Upper Body Dressing : Moderate assistance;Sitting Upper Body Dressing Details (indicate cue type and reason): change gown Lower Body Dressing: Total assistance;Sit to/from stand   Toilet Transfer: Minimal assistance;Moderate assistance;+2 for physical assistance;+2 for safety/equipment;Stand-pivot Toilet Transfer Details (indicate cue type and reason):  simulated bed to recliner Toileting- Clothing Manipulation and Hygiene: Total assistance;Sit to/from stand       Functional mobility during ADLs: Minimal assistance;Moderate assistance;+2 for physical assistance;+2 for safety/equipment;Cueing for sequencing;Rolling walker General ADL Comments: Patient limited by fatigue, pain, and N/V during session. Nurse made aware of emesis during session and in to give medication for nausea. Patient up to recliner at end of session. Plans to go to SNF for rehab. Will defer OT treatment to SNF.     Vision     Perception     Praxis      Pertinent Vitals/Pain Pain Assessment: 0-10 Pain Score: 5  Pain Location: R hip Pain Descriptors / Indicators: Aching;Sore Pain Intervention(s): Limited activity within patient's tolerance;Monitored during session;Premedicated before session;Ice applied     Hand Dominance Right   Extremity/Trunk Assessment Upper Extremity Assessment Upper Extremity Assessment: Overall WFL for tasks assessed   Lower Extremity Assessment Lower Extremity Assessment: Defer to PT evaluation   Cervical / Trunk Assessment Cervical / Trunk Assessment: Normal   Communication Communication Communication: No difficulties   Cognition Arousal/Alertness: Awake/alert Behavior During Therapy: WFL for tasks assessed/performed Overall Cognitive Status: Within Functional Limits for tasks assessed                     General Comments       Exercises      Shoulder Instructions      Home Living Family/patient expects to be discharged to:: Skilled nursing facility  Living Arrangements: Alone   Type of Home: House Home Access: Stairs to enter [employer] of Steps: 6 Entrance Stairs-Rails: Right Home Layout: One level               Home Equipment: Walker - 2 wheels          Prior Functioning/Environment Level of Independence: Independent with assistive device(s)        Comments: Used cane or RW  to assist as needed    OT Diagnosis: Generalized weakness;Acute pain   OT Problem List: Decreased strength;Decreased range of motion;Decreased activity tolerance;Decreased knowledge of use of DME or AE;Pain   OT Treatment/Interventions:      OT Goals(Current goals can be found in the care plan section) Acute Rehab OT Goals Patient Stated Goal: Regain IND at short term rehab prior to return home with ltd assist OT Goal Formulation: All assessment and education complete, DC therapy  OT Frequency:     Barriers to D/C:            Co-evaluation PT/OT/SLP Co-Evaluation/Treatment: Yes Reason for Co-Treatment: For patient/therapist safety PT goals addressed during session: Mobility/safety with mobility OT goals addressed during session: ADL's and self-care      End of Session Equipment Utilized During Treatment: Rolling walker;Gait belt Nurse Communication: Other (comment) (emesis during session)  Activity Tolerance: Other (comment) (limited by N/V, fatigue, pain) Patient left: in chair;with call bell/phone within reach;with nursing/sitter in room   Time: 1008-1029 OT Time Calculation (min): 21 min Charges:  OT General Charges $OT Visit: 1 Procedure OT Evaluation $OT Eval Low Complexity: 1 Procedure G-Codes:    Millicent Blazejewski A July 25, 2016, 1:28 PM

## 2016-07-12 NOTE — NC FL2 (Signed)
[place] MEDICAID FL2 LEVEL OF CARE SCREENING TOOL     IDENTIFICATION  Patient Name: Amanda BenderCarol A Ponce Birthdate: 12/19/1935 Sex: female Admission Date (Current Location): 07/11/2016  [facility] [place]   Facility and Address:  [facility],  [address], [place] [id number]      Provider Number: [id number]  Attending Physician Name and Address:  Kathryne Hitchhristopher Y Blackman, *  Relative Name and Phone Number:       Current Level of Care: Hospital Recommended Level of Care: Skilled Nursing Facility Prior Approval Number:    Date Approved/Denied:   PASRR Number: [id number]  Discharge Plan: SNF    Current Diagnoses: Patient Active Problem List   Diagnosis Date Noted  . Osteoarthritis of right hip 07/11/2016  . Status post total replacement of right hip 07/11/2016  . Osteoarthritis of left hip 08/08/2015  . Status post total replacement of left hip 08/08/2015    Orientation RESPIRATION BLADDER Height & Weight     Self, Time, Situation, Place  Normal Indwelling catheter Weight: 200 lb 6 oz (90.9 kg) Height:  5\' 4"  (162.6 cm)  BEHAVIORAL SYMPTOMS/MOOD NEUROLOGICAL BOWEL NUTRITION STATUS  Other (Comment) (No Behaviors)   Continent Diet  AMBULATORY STATUS COMMUNICATION OF NEEDS Skin   Extensive Assist Verbally Surgical wounds                       Personal Care Assistance Level of Assistance  Bathing, Feeding, Dressing Bathing Assistance: Maximum assistance Feeding assistance: Independent Dressing Assistance: Maximum assistance     Functional Limitations Info  Sight, Hearing, Speech Sight Info: Adequate Hearing Info: Adequate Speech Info: Adequate    SPECIAL CARE FACTORS FREQUENCY  PT (By licensed PT), OT (By licensed OT)     PT Frequency: 5 x wk OT Frequency: 5 x wk            Contractures Contractures Info: Not present    Additional Factors Info  Code Status Code Status Info: Full Code             Current  Medications (07/12/2016):  This is the current hospital active medication list Current Facility-Administered Medications  Medication Dose Route Frequency Provider Last Rate Last Dose  . 0.9 %  sodium chloride infusion   Intravenous Continuous Kathryne Hitchhristopher Y Blackman, MD 75 mL/hr at 07/12/16 0630    . acetaminophen (TYLENOL) tablet 650 mg  650 mg Oral Q6H PRN Kathryne Hitchhristopher Y Blackman, MD       Or  . acetaminophen (TYLENOL) suppository 650 mg  650 mg Rectal Q6H PRN Kathryne Hitchhristopher Y Blackman, MD      . alum & mag hydroxide-simeth (MAALOX/MYLANTA) 200-200-20 MG/5ML suspension 30 mL  30 mL Oral Q4H PRN Kathryne Hitchhristopher Y Blackman, MD      . aspirin EC tablet 325 mg  325 mg Oral BID PC Kathryne Hitchhristopher Y Blackman, MD   325 mg at 07/12/16 0807  . diphenhydrAMINE (BENADRYL) 12.5 MG/5ML elixir 12.5-25 mg  12.5-25 mg Oral Q4H PRN Kathryne Hitchhristopher Y Blackman, MD      . docusate sodium (COLACE) capsule 100 mg  100 mg Oral BID Kathryne Hitchhristopher Y Blackman, MD   100 mg at 07/12/16 0807  . FLUoxetine (PROZAC) capsule 20 mg  20 mg Oral Daily Kathryne Hitchhristopher Y Blackman, MD   20 mg at 07/12/16 0807  . furosemide (LASIX) tablet 20 mg  20 mg Oral Daily Kathryne Hitchhristopher Y Blackman, MD   20 mg at 07/12/16 0807  . hydrochlorothiazide (HYDRODIURIL)  tablet 25 mg  25 mg Oral Daily Kathryne Hitch, MD   25 mg at 07/12/16 0807  . HYDROcodone-acetaminophen (NORCO/VICODIN) 5-325 MG per tablet 1-2 tablet  1-2 tablet Oral Q4H PRN Kathryne Hitch, MD   1 tablet at 07/12/16 [phone]  . HYDROmorphone (DILAUDID) injection 1 mg  1 mg Intravenous Q2H PRN Kathryne Hitch, MD   1 mg at 07/11/16 1741  . menthol-cetylpyridinium (CEPACOL) lozenge 3 mg  1 lozenge Oral PRN Kathryne Hitch, MD       Or  . phenol (CHLORASEPTIC) mouth spray 1 spray  1 spray Mouth/Throat PRN Kathryne Hitch, MD      . methocarbamol (ROBAXIN) tablet 500 mg  500 mg Oral Q6H PRN Kathryne Hitch, MD   500 mg at 07/12/16 0806   Or  . methocarbamol (ROBAXIN) 500 mg  in dextrose 5 % 50 mL IVPB  500 mg Intravenous Q6H PRN Kathryne Hitch, MD   500 mg at 07/11/16 0947  . metoCLOPramide (REGLAN) tablet 5-10 mg  5-10 mg Oral Q8H PRN Kathryne Hitch, MD       Or  . metoCLOPramide (REGLAN) injection 5-10 mg  5-10 mg Intravenous Q8H PRN Kathryne Hitch, MD      . ondansetron [facility]) tablet 4 mg  4 mg Oral Q6H PRN Kathryne Hitch, MD       Or  . ondansetron [facility]) injection 4 mg  4 mg Intravenous Q6H PRN Kathryne Hitch, MD   4 mg at 07/11/16 1604  . polyethylene glycol (MIRALAX / GLYCOLAX) packet 17 g  17 g Oral Daily PRN Kathryne Hitch, MD      . potassium chloride (K-DUR,KLOR-CON) CR tablet 10 mEq  10 mEq Oral Daily Kathryne Hitch, MD   10 mEq at 07/12/16 7829     Discharge Medications: Please see discharge summary for a list of discharge medications.  Relevant Imaging Results:  Relevant Lab Results:   Additional Information ss # [id number]  Amanda Ponce, Amanda Gave, LCSW

## 2016-07-12 NOTE — Progress Notes (Signed)
Subjective: 1 Day Post-Op Procedure(s) (LRB): RIGHT TOTAL HIP ARTHROPLASTY ANTERIOR APPROACH (Right) Patient reports pain as moderate.  Nausea limiting mobility.  Vitals stable.  Acute blood loss anemia from surgery.  Hypokalemia, but chronic and on meds for this.  Objective: Vital signs in last 24 hours: Temp:  [97.7 F (36.5 C)-98.7 F (37.1 C)] 98.5 F (36.9 C) (08/11 1404) Pulse Rate:  [90-102] 102 (08/11 1404) Resp:  [15-18] 15 (08/11 1404) BP: (114-130)/(47-66) 114/51 (08/11 1404) SpO2:  [95 %-99 %] 95 % (08/11 1404)  Intake/Output from previous day: 08/10 0701 - 08/11 0700 In: 3552.5 [P.O.:890; I.V.:2562.5; IV Piggyback:100] Out: 1850 [Urine:1600; Blood:250] Intake/Output this shift: Total I/O In: 240 [P.O.:240] Out: 500 [Urine:500]   Recent Labs  07/12/16 0401  HGB 9.5*    Recent Labs  07/12/16 0401  WBC 9.4  RBC 2.93*  HCT 28.3*  PLT 209    Recent Labs  07/12/16 0401  NA 138  K 3.3*  CL 98*  CO2 33*  BUN 13  CREATININE 0.97  GLUCOSE 133*  CALCIUM 8.2*   No results for input(s): LABPT, INR in the last 72 hours.  Sensation intact distally Intact pulses distally Dorsiflexion/Plantar flexion intact Incision: scant drainage  Assessment/Plan: 1 Day Post-Op Procedure(s) (LRB): RIGHT TOTAL HIP ARTHROPLASTY ANTERIOR APPROACH (Right) Up with therapy Plan for discharge tomorrow Discharge to SNF  [facility],Sonal Dorwart Y 07/12/2016, 3:32 PM

## 2016-07-13 LAB — CBC
HEMATOCRIT: 27.7 % — AB (ref 36.0–46.0)
HEMOGLOBIN: 9.4 g/dL — AB (ref 12.0–15.0)
MCH: 33.1 pg (ref 26.0–34.0)
MCHC: 33.9 g/dL (ref 30.0–36.0)
MCV: 97.5 fL (ref 78.0–100.0)
PLATELETS: 239 10*3/uL (ref 150–400)
RBC: 2.84 MIL/uL — AB (ref 3.87–5.11)
RDW: 14 % (ref 11.5–15.5)
WBC: 12.4 10*3/uL — AB (ref 4.0–10.5)

## 2016-07-13 MED ORDER — HYDROCODONE-ACETAMINOPHEN 5-325 MG PO TABS: 1.0000 | ORAL_TABLET | ORAL | 0 refills | Status: DC | PRN

## 2016-07-13 MED ORDER — ASPIRIN 325 MG PO TBEC: 325.0000 mg | DELAYED_RELEASE_TABLET | Freq: Every day | ORAL | 0 refills | Status: DC

## 2016-07-13 MED ORDER — METHOCARBAMOL 500 MG PO TABS: 500.0000 mg | ORAL_TABLET | Freq: Four times a day (QID) | ORAL | 0 refills | Status: DC | PRN

## 2016-07-13 NOTE — Clinical Social Work Placement (Signed)
   CLINICAL SOCIAL WORK PLACEMENT  NOTE  Date:  07/13/2016  Patient Details  Name: Amanda Ponce MRN: [id number] Date of Birth: January 27, 1936  Clinical Social Work is seeking post-discharge placement for this patient at the Skilled  Nursing Facility level of care (*CSW will initial, date and re-position this form in  chart as items are completed):  Yes   Patient/family provided with [facility] Clinical Social Work Department's list of facilities offering this level of care within the geographic area requested by the patient (or if unable, by the patient's family).  Yes   Patient/family informed of their freedom to choose among providers that offer the needed level of care, that participate in Medicare, Medicaid or managed care program needed by the patient, have an available bed and are willing to accept the patient.  Yes   Patient/family informed of [facility]'s ownership interest in [facility] and [facility], as well as of the fact that they are under no obligation to receive care at these facilities.  PASRR submitted to EDS on       PASRR number received on       Existing PASRR number confirmed on 07/12/16     FL2 transmitted to all facilities in geographic area requested by pt/family on 07/12/16     FL2 transmitted to all facilities within larger geographic area on       Patient informed that his/her managed care company has contracts with or will negotiate with certain facilities, including the following:  [facility]     Yes   Patient/family informed of bed offers received.  [facility], [facility]     Physician recommends and patient [facility]    [facility] 07[facility].  Patient to be transferred to facility by: Patient friend Michele Mcalpinehil.      Patient family notified on 07/13/16 of transfer.  Name of family member notified:      Patient reports friend Phil   PHYSICIAN        Additional Comment:    _______________________________________________ Clearance CootsNicole A Matthan Sledge, LCSW 07/13/2016, 9:32 AM

## 2016-07-13 NOTE — Care Management Note (Signed)
Case Management Note  Patient Details  Name: Amanda BenderCarol A Ponce MRN: [id number] Date of Birth: 03-02-1936  Subjective/Objective:   Right THA                 Action/Plan: Discharge Planning: AVS reviewed:  Chart reviewed. Plan is dc to SNF. CSW following for SNF placement.    Expected Discharge Date:  07/13/2016             Expected Discharge Plan:  Skilled Nursing Facility  In-House Referral:  Clinical Social Work  Discharge planning Services  CM Consult  Post Acute Care Choice:  NA Choice offered to:  NA  DME Arranged:  N/A DME Agency:  NA  HH Arranged:  NA HH Agency:  NA  Status of Service:  Completed, signed off  If discussed at [employer] Length of Stay Meetings, dates discussed:    Additional Comments:  Elliot CousinShavis, Drena Ham Ellen, RN 07/13/2016, 4:18 PM

## 2016-07-13 NOTE — Progress Notes (Signed)
DC summary faxed. Scripts placed.  RN Given Report. Patient reports her family-Phill will transport her to the facility.

## 2016-07-13 NOTE — Progress Notes (Signed)
Physical Therapy Treatment Patient Details Name: Amanda BenderCarol A Ponce MRN: [id number] DOB: 05/06/1936 Today's Date: 07/13/2016    History of Present Illness Pt s/p R THR with hx of L THR (16) and lumbar laminectomy (13)    PT Comments    Marked improvement in activity tolerance and quality of movement.  No c/o nausea this date.  Follow Up Recommendations  SNF     Equipment Recommendations  None recommended by PT    Recommendations for Other Services OT consult     Precautions / Restrictions Precautions Precautions: Fall Restrictions Weight Bearing Restrictions: No Other Position/Activity Restrictions: WBAT    Mobility  Bed Mobility Overal bed mobility: Needs Assistance Bed Mobility: Supine to Sit     Supine to sit: Min assist;Mod assist     General bed mobility comments: cues for sequence and use of L LE to self assist; Physical assist to manage R LE and to bring trunk to upright  Transfers Overall transfer level: Needs assistance Equipment used: Rolling walker (2 wheeled) Transfers: Sit to/from Stand Sit to Stand: Min assist;From elevated surface         General transfer comment: cues for LE management and use of UEs to self assist.  From EOB, to /from [facility] and to chair  Ambulation/Gait Ambulation/Gait assistance: Min assist;+2 safety/equipment Ambulation Distance (Feet): 46 Feet Assistive device: Rolling walker (2 wheeled) Gait Pattern/deviations: Step-to pattern;Decreased step length - right;Decreased stance time - right;Shuffle;Trunk flexed Gait velocity: decr Gait velocity interpretation: Below normal speed for age/gender General Gait Details: Cues for sequence, posture and position from RW. Physical assist for balance/support and to advance R LE   Stairs            Wheelchair Mobility    Modified Rankin (Stroke Patients Only)       Balance                                    Cognition Arousal/Alertness: Awake/alert Behavior  During Therapy: WFL for tasks assessed/performed Overall Cognitive Status: Within Functional Limits for tasks assessed                      Exercises Total Joint Exercises Ankle Circles/Pumps: AROM;Both;15 reps;Supine Quad Sets: AROM;Both;10 reps;Supine Heel Slides: AAROM;Right;Supine;20 reps Hip ABduction/ADduction: AAROM;Right;Supine;15 reps    General Comments        Pertinent Vitals/Pain Pain Assessment: 0-10 Pain Score: 5  Pain Location: R hip Pain Descriptors / Indicators: Aching;Burning Pain Intervention(s): Limited activity within patient's tolerance;Monitored during session;Premedicated before session;Ice applied    Home Living                      Prior Function            PT Goals (current goals can now be found in the care plan section) Acute Rehab PT Goals Patient Stated Goal: Regain IND at short term rehab prior to return home with ltd assist PT Goal Formulation: With patient Time For Goal Achievement: 07/18/16 Potential to Achieve Goals: Good Progress towards PT goals: Progressing toward goals    Frequency  7X/week    PT Plan Current plan remains appropriate    Co-evaluation             End of Session Equipment Utilized During Treatment: Gait belt Activity Tolerance: Patient tolerated treatment well;Patient limited by fatigue Patient left: in chair;with call bell/phone within reach;with  chair alarm set     Time: 1002-1040 PT Time Calculation (min) (ACUTE ONLY): 38 min  Charges:  $Gait Training: 8-22 mins $Therapeutic Exercise: 8-22 mins $Therapeutic Activity: 8-22 mins                    G Codes:      Nedim Oki,Derk Aug 05, 2016, 12:20 PM

## 2016-07-13 NOTE — Progress Notes (Signed)
Patient ID: Amanda Ponce, female   DOB: 1936/10/17, 80 y.o.   MRN: [id number] Doing well overall.  Can be discharged to skilled nursing today.

## 2016-07-13 NOTE — Progress Notes (Signed)
Discharged from floor via w/c for [facility]. Belongings & family with pt. No changes in assessment. [name], [employer]

## 2016-07-13 NOTE — Progress Notes (Signed)
LCSWA aware of DC. [facility]. Facility will take patient around 3:00PM today per Admissions Coordinator.

## 2016-07-13 NOTE — Discharge Instructions (Signed)

## 2016-07-13 NOTE — Discharge Summary (Signed)
Patient ID: Amanda BenderCarol A Ponce MRN: [id number] DOB/AGE: [birth year] 78 y.o.  Admit date: 07/11/2016 Discharge date: 07/13/2016  Admission Diagnoses:  Principal Problem:   Osteoarthritis of right hip Active Problems:   Status post total replacement of right hip   Discharge Diagnoses:  Same  Past Medical History:  Diagnosis Date  . Arthritis    OA - L hip  . Depression    situational- concerns about her weight and health  . Edema of both legs    ankles swell- pt attributes edema to her weight  . GERD (gastroesophageal reflux disease)   . Headache(784.0)    Sinsus  . Heart murmur   . History of blood transfusion    as a child, transfusion post tonsillectomy- also states that she has areas inher nose that has bleeding tendencies   . History of hiatal hernia   . Hypertension   . Seasonal allergies   . 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      Surgeries: Procedure(s): RIGHT TOTAL HIP ARTHROPLASTY ANTERIOR APPROACH on 07/11/2016   Consultants:   Discharged Condition: Improved  Hospital Course: Amanda Ponce is an 80 y.o. female who was admitted 07/11/2016 for operative treatment ofOsteoarthritis of right hip. Patient has severe unremitting pain that affects sleep, daily activities, and work/hobbies. After pre-op clearance the patient was taken to the operating room on 07/11/2016 and underwent  Procedure(s): RIGHT TOTAL HIP ARTHROPLASTY ANTERIOR APPROACH.    Patient was given perioperative antibiotics: Anti-infectives    Start     Dose/Rate Route Frequency Ordered Stop   07/11/16 1400  ceFAZolin (ANCEF) IVPB 1 g/50 mL premix     1 g 100 mL/hr over 30 Minutes Intravenous Every 6 hours 07/11/16 1021 07/11/16 2210   07/11/16 0606  ceFAZolin (ANCEF) IVPB 2g/100 mL premix     2 g 200 mL/hr over 30 Minutes Intravenous On call to O.R. 07/11/16 0606 07/11/16 0741       Patient was given sequential compression devices, early ambulation, and  chemoprophylaxis to prevent DVT.  Patient benefited maximally from hospital stay and there were no complications.    Recent vital signs: Patient Vitals for the past 24 hrs:  BP Temp Temp src Pulse Resp SpO2  07/13/16 0455 (!) 110/50 98 F (36.7 C) Oral 78 20 100 %  07/12/16 2040 113/65 97.5 F (36.4 C) Oral (!) 104 20 97 %  07/12/16 1404 (!) 114/51 98.5 F (36.9 C) - (!) 102 15 95 %  07/12/16 1100 120/65 98.3 F (36.8 C) Oral 96 16 98 %     Recent laboratory studies:  Recent Labs  07/12/16 0401 07/13/16 0458  WBC 9.4 12.4*  HGB 9.5* 9.4*  HCT 28.3* 27.7*  PLT 209 239  NA 138  --   K 3.3*  --   CL 98*  --   CO2 33*  --   BUN 13  --   CREATININE 0.97  --   GLUCOSE 133*  --   CALCIUM 8.2*  --      Discharge Medications:     Medication List    TAKE these medications   ALEVE PO Take 1 capsule by mouth daily as needed (pain).   aspirin 325 MG EC tablet Take 1 tablet (325 mg total) by mouth daily.   diphenhydrAMINE 25 mg capsule Commonly known as:  BENADRYL Take 25 mg by mouth at bedtime.   FLUoxetine 20 MG capsule Commonly  known as:  PROZAC Take 1 capsule by mouth daily.   furosemide 20 MG tablet Commonly known as:  LASIX Take 1 tablet by mouth daily.   hydrochlorothiazide 25 MG tablet Commonly known as:  HYDRODIURIL Take 25 mg by mouth daily.   HYDROcodone-acetaminophen 5-325 MG tablet Commonly known as:  NORCO/VICODIN Take 1-2 tablets by mouth every 4 (four) hours as needed for moderate pain.   methocarbamol 500 MG tablet Commonly known as:  ROBAXIN Take 1 tablet (500 mg total) by mouth every 6 (six) hours as needed for muscle spasms.   potassium chloride 10 MEQ tablet Commonly known as:  K-DUR Take 1 tablet by mouth daily.       Diagnostic Studies: Dg C-arm 1-60 Min-no Report  Result Date: 07/11/2016 CLINICAL DATA: hip C-ARM 1-60 MINUTES Fluoroscopy was utilized by the requesting physician.  No radiographic interpretation.   Dg Hip Port  Unilat With Pelvis 1v Right  Result Date: 07/11/2016 CLINICAL DATA:  Status post right hip replacement EXAM: DG HIP (WITH OR WITHOUT PELVIS) 1V PORT RIGHT COMPARISON:  06/03/2016 FINDINGS: Bilateral hip replacements are noted. No acute bony or soft tissue abnormality is seen. IMPRESSION: Status post right hip replacement.  No acute abnormality noted. Electronically Signed   By: Alcide Clever M.D.   On: 07/11/2016 09:56   Dg Hip Unilat With Pelvis 2-3 Views Right  Result Date: 07/11/2016 CLINICAL DATA:  Right total hip arthroplasty EXAM: DG C-ARM 1-60 MIN-NO REPORT; DG HIP (WITH OR WITHOUT PELVIS) 2-3V RIGHT COMPARISON:  06/03/2016 pelvic and right hip radiograph FINDINGS: Fluoroscopy time 0.5 minutes. Two spot fluoroscopic nondiagnostic intraoperative radiographs demonstrate postsurgical changes from interval right total hip arthroplasty. IMPRESSION: Intraoperative fluoroscopic guidance for right total hip arthroplasty. Electronically Signed   By: Delbert Phenix M.D.   On: 07/11/2016 08:48    Disposition: 03-Skilled Nursing Facility  Discharge Instructions    Call MD / Call 911    Complete by:  As directed   If you experience chest pain or shortness of breath, CALL 911 and be transported to the hospital emergency room.  If you develope a fever above 101 F, pus (white drainage) or increased drainage or redness at the wound, or calf pain, call your surgeon's office.   Constipation Prevention    Complete by:  As directed   Drink plenty of fluids.  Prune juice may be helpful.  You may use a stool softener, such as Colace (over the counter) 100 mg twice a day.  Use MiraLax (over the counter) for constipation as needed.   Diet - low sodium heart healthy    Complete by:  As directed   Discharge patient    Complete by:  As directed   Increase activity slowly as tolerated    Complete by:  As directed      Follow-up Information    Kathryne Hitch, MD Follow up in 2 week(s).   Specialty:  Orthopedic  Surgery Contact information: [address] [place] [phone]            Signed: Kathryne Hitch 07/13/2016, 7:25 AM

## 2016-07-13 NOTE — Plan of Care (Signed)
Problem: Health Behavior/Discharge Planning: Goal: Ability to manage health-related needs will improve Outcome: Adequate for Discharge To SNF   

## 2016-07-15 ENCOUNTER — Non-Acute Institutional Stay: Payer: Medicare HMO | Admitting: Adult Health

## 2016-07-15 ENCOUNTER — Encounter: Payer: Self-pay | Admitting: Adult Health

## 2016-07-15 DIAGNOSIS — D72829 Elevated white blood cell count, unspecified: Secondary | ICD-10-CM

## 2016-07-15 DIAGNOSIS — M1611 Unilateral primary osteoarthritis, right hip: Secondary | ICD-10-CM

## 2016-07-15 DIAGNOSIS — F329 Major depressive disorder, single episode, unspecified: Secondary | ICD-10-CM

## 2016-07-15 DIAGNOSIS — I10 Essential (primary) hypertension: Secondary | ICD-10-CM | POA: Diagnosis not present

## 2016-07-15 DIAGNOSIS — R2681 Unsteadiness on feet: Secondary | ICD-10-CM

## 2016-07-15 DIAGNOSIS — E876 Hypokalemia: Secondary | ICD-10-CM | POA: Diagnosis not present

## 2016-07-15 DIAGNOSIS — R600 Localized edema: Secondary | ICD-10-CM

## 2016-07-15 DIAGNOSIS — F32A Depression, unspecified: Secondary | ICD-10-CM

## 2016-07-15 DIAGNOSIS — D638 Anemia in other chronic diseases classified elsewhere: Secondary | ICD-10-CM

## 2016-07-15 DIAGNOSIS — K5901 Slow transit constipation: Secondary | ICD-10-CM

## 2016-07-15 NOTE — Progress Notes (Signed)
Patient ID: [name], female   DOB: October 15, 1936, 80 y.o.   MRN: [id number]    DATE:  07/15/2016   MRN:  [id number]  BIRTHDAY: [birth year]  Facility:  Nursing Home Location:  [name]  Nursing Home Room Number: 1107-P  LEVEL OF CARE:  SNF [phone])  Contact Information    Name Relation Home Work Mobile   Dunford,Suzanne Daughter [phone]  [phone]   Richardson,Jami Daughter [phone]     Dia Sitter Daughter [phone]         Code Status History    Date Active Date Inactive Code Status Order ID Comments User Context   07/11/2016 10:21 AM 07/13/2016  7:42 PM Full Code [id number]  Kathryne Hitch, MD Inpatient   08/08/2015  4:18 PM 08/11/2015  5:13 PM Full Code [id number]  Kathryne Hitch, MD Inpatient   08/19/2012 12:45 PM 08/20/2012  2:05 PM Full Code [id number]  Vincente Liberty, RN Inpatient       Chief Complaint  Patient presents with  . Hospitalization Follow-up    HISTORY OF PRESENT ILLNESS:  This is a 80 year old female who [facility] had tried total hip replacement on 07/11/16.  She has been admitted for a short-term rehabilitation.  She is seen in her room today and complained of constipation. She said that her pain is well controlled.  PAST MEDICAL HISTORY:  Past Medical History:  Diagnosis Date  . Arthritis    OA - L hip  . Depression    situational- concerns about her weight and health  . Edema of both legs    ankles swell- pt attributes edema to her weight  . GERD (gastroesophageal reflux disease)   . Headache(784.0)    Sinsus  . Heart murmur   . History of blood transfusion    as a child, transfusion post tonsillectomy- also states that she has areas inher nose that has bleeding tendencies   . History of hiatal hernia   . Hypertension   . Seasonal allergies   . 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       CURRENT MEDICATIONS: Reviewed  Patient's Medications  New Prescriptions   No medications on file  Previous Medications   ASPIRIN EC 325 MG EC TABLET    Take 1 tablet (325 mg total) by mouth daily.   FLUOXETINE (PROZAC) 20 MG CAPSULE    Take 1 capsule by mouth daily.    FUROSEMIDE (LASIX) 20 MG TABLET    Take 1 tablet by mouth daily.   HYDROCHLOROTHIAZIDE (HYDRODIURIL) 25 MG TABLET    Take 25 mg by mouth daily.    HYDROCODONE-ACETAMINOPHEN (NORCO/VICODIN) 5-325 MG TABLET    Take 1-2 tablets by mouth every 4 (four) hours as needed for moderate pain.   METHOCARBAMOL (ROBAXIN) 500 MG TABLET    Take 1 tablet (500 mg total) by mouth every 6 (six) hours as needed for muscle spasms.   NAPROXEN SODIUM (ALEVE PO)    Take 1 capsule by mouth daily as needed (pain).   POTASSIUM CHLORIDE (K-DUR) 10 MEQ TABLET    Take 1 tablet by mouth daily.  Modified Medications   No medications on file  Discontinued Medications   DIPHENHYDRAMINE (BENADRYL) 25 MG CAPSULE    Take 25 mg by mouth at bedtime.     No Known Allergies  REVIEW OF SYSTEMS:  GENERAL: no change in appetite, no fatigue, no weight changes, no fever, chills or weakness EYES: Denies change in vision, dry eyes, eye pain, itching or discharge EARS: Denies change in hearing, ringing in ears, or earache NOSE: Denies nasal congestion or epistaxis MOUTH and THROAT: Denies oral discomfort, gingival pain or bleeding, pain from teeth or hoarseness   RESPIRATORY: no cough, SOB, DOE, wheezing, hemoptysis CARDIAC: no chest pain, or palpitations GI: no abdominal pain, diarrhea, heart burn, nausea or vomiting, + constipation GU: Denies dysuria, frequency, hematuria, incontinence, or discharge PSYCHIATRIC: Denies feeling of depression or anxiety. No report of hallucinations, insomnia, paranoia, or agitation    PHYSICAL EXAMINATION  GENERAL APPEARANCE: Well nourished. In no acute distress. Obese SKIN:   Right hip surgical incision is covered with aquacel dressing, dry, no erythema HEAD: Normal in size and contour. No evidence of trauma EYES: Lids open and close normally. No blepharitis, entropion or ectropion. PERRL. Conjunctivae are clear and sclerae are white. Lenses are without opacity EARS: Pinnae are normal. Patient hears normal voice tunes of the examiner MOUTH and THROAT: Lips are without lesions. Oral mucosa is moist and without lesions. Tongue is normal in shape, size, and color and without lesions NECK: supple, trachea midline, no neck masses, no thyroid tenderness, no thyromegaly LYMPHATICS: no LAN in the neck, no supraclavicular LAN RESPIRATORY: breathing is even & unlabored, BS CTAB CARDIAC: RRR, no murmur,no extra heart sounds, BLE 2+ edema GI: abdomen soft, normal BS, no masses, no tenderness, no hepatomegaly, no splenomegaly EXTREMITIES:  Able to move X 4 extremities PSYCHIATRIC: Alert and oriented X 3. Affect and behavior are appropriate  LABS/RADIOLOGY: Labs reviewed: Basic Metabolic Panel:  Recent Labs  09/81/1908/06/16 0429 07/04/16 1330 07/12/16 0401  NA 137 138 138  K 4.1 4.0 3.3*  CL 101 97* 98*  CO2 26 31 33*  GLUCOSE 173* 84 133*  BUN 20 22* 13  CREATININE 1.08* 1.11* 0.97  CALCIUM 8.2* 9.2 8.2*   CBC:  Recent Labs  07/04/16 1330 07/12/16 0401 07/13/16 0458  WBC 8.0 9.4 12.4*  HGB 12.4 9.5* 9.4*  HCT 37.1 28.3* 27.7*  MCV 94.4 96.6 97.5  PLT 255 209 239    Dg C-arm 1-60 Min-no Report  Result Date: 07/11/2016 CLINICAL DATA: hip C-ARM 1-60 MINUTES Fluoroscopy was utilized by the requesting physician.  No radiographic interpretation.   Dg Hip Port Unilat With Pelvis 1v Right  Result Date: 07/11/2016 CLINICAL DATA:  Status post right hip replacement EXAM: DG HIP (WITH OR WITHOUT PELVIS) 1V PORT RIGHT COMPARISON:  06/03/2016 FINDINGS: Bilateral hip replacements are noted. No acute bony or soft tissue abnormality is seen. IMPRESSION: Status post right  hip replacement.  No acute abnormality noted. Electronically Signed   By: Alcide CleverMark  Lukens M.D.   On: 07/11/2016 09:56   Dg Hip Unilat With Pelvis 2-3 Views Right  Result Date: 07/11/2016 CLINICAL DATA:  Right total hip arthroplasty EXAM: DG C-ARM 1-60 MIN-NO REPORT; DG HIP (WITH OR WITHOUT PELVIS) 2-3V RIGHT COMPARISON:  06/03/2016 pelvic and right hip radiograph FINDINGS: Fluoroscopy time 0.5 minutes. Two spot fluoroscopic nondiagnostic intraoperative radiographs demonstrate postsurgical changes from interval right total hip arthroplasty. IMPRESSION: Intraoperative fluoroscopic guidance for right total hip arthroplasty. Electronically Signed   By: Delbert PhenixJason A Poff M.D.   On: 07/11/2016 08:48    ASSESSMENT/PLAN:  Unsteady gait - for rehabilitation, PT and OT    Right hip osteoarthritis S/P right total hip replacement - for rehabilitation, PT and OT; follow-up  with orthopedic surgeon, Dr. Doneen Poissonhristopher Blackman, in 2 weeks; continue aspirin 325 mg EC 1 tab by mouth daily for DVT prophylaxis; Norco 5/325 mg 1-2 tabs by mouth every 4 hours when necessary for pain; Robaxin 500 mg 1 tab by mouth every 6 hours when necessary for muscle spasm  Depression - mood is stable; continue Prozac 20 mg 1 capsule by mouth daily  Hypokalemia - continue K Dur 10 meq 1 tab by mouth daily; check CMP Lab Results  Component Value Date   K 3.3 (L) 07/12/2016   Anemia of chronic disease - check CBC Lab Results  Component Value Date   HGB 9.4 (L) 07/13/2016    Lower extremity edema, bilateral - continue Lasix 20 mg 1 tab by mouth daily  Hypertension - continue hydrochlorothiazide 25 mg 1 tab by mouth daily  Leukocytosis - no source of infection identified; check CBC Lab Results  Component Value Date   WBC 12.4 (H) 07/13/2016   Constipation - start senna S 8.6-50 mg 2 tabs by mouth twice a day and MiraLAX 17 g by mouth twice a day    Goals of care:  Short-term rehabilitation    Kenard GowerMonina Medina-Vargas,  NP [facility] [phone]

## 2016-07-16 ENCOUNTER — Non-Acute Institutional Stay (SKILLED_NURSING_FACILITY): Payer: Medicare HMO | Admitting: Internal Medicine

## 2016-07-16 ENCOUNTER — Encounter: Payer: Self-pay | Admitting: Internal Medicine

## 2016-07-16 DIAGNOSIS — F329 Major depressive disorder, single episode, unspecified: Secondary | ICD-10-CM | POA: Diagnosis not present

## 2016-07-16 DIAGNOSIS — K5901 Slow transit constipation: Secondary | ICD-10-CM

## 2016-07-16 DIAGNOSIS — R2681 Unsteadiness on feet: Secondary | ICD-10-CM

## 2016-07-16 DIAGNOSIS — M1611 Unilateral primary osteoarthritis, right hip: Secondary | ICD-10-CM | POA: Diagnosis not present

## 2016-07-16 DIAGNOSIS — D72829 Elevated white blood cell count, unspecified: Secondary | ICD-10-CM

## 2016-07-16 DIAGNOSIS — E876 Hypokalemia: Secondary | ICD-10-CM

## 2016-07-16 DIAGNOSIS — R600 Localized edema: Secondary | ICD-10-CM

## 2016-07-16 DIAGNOSIS — I10 Essential (primary) hypertension: Secondary | ICD-10-CM | POA: Diagnosis not present

## 2016-07-16 DIAGNOSIS — D62 Acute posthemorrhagic anemia: Secondary | ICD-10-CM | POA: Diagnosis not present

## 2016-07-16 LAB — CBC AND DIFFERENTIAL
HEMATOCRIT: 26 % — AB (ref 36–46)
HEMOGLOBIN: 8.7 g/dL — AB (ref 12.0–16.0)
PLATELETS: 263 10*3/uL (ref 150–399)
WBC: 7.4 10*3/mL

## 2016-07-16 LAB — BASIC METABOLIC PANEL
BUN: 26 mg/dL — AB (ref 4–21)
Creatinine: 0.9 mg/dL (ref 0.5–1.1)
Glucose: 99 mg/dL
Potassium: 2.9 mmol/L — AB (ref 3.4–5.3)
Sodium: 141 mmol/L (ref 137–147)

## 2016-07-16 LAB — HEPATIC FUNCTION PANEL
ALK PHOS: 45 U/L (ref 25–125)
ALT: 13 U/L (ref 7–35)
AST: 17 U/L (ref 13–35)
BILIRUBIN, TOTAL: 0.6 mg/dL

## 2016-07-16 NOTE — Progress Notes (Addendum)
LOCATION: [name]  PCP: Eartha InchBADGER,MICHAEL C, MD   Code Status: Full Code  Goals of care: Advanced Directive information Advanced Directives 07/11/2016  Does patient have an advance directive? Yes  Type of Advance Directive Healthcare Power of Attorney  Does patient want to make changes to advanced directive? No - Patient declined  Copy of advanced directive(s) in chart? No - copy requested  Pre-existing out of facility DNR order (yellow form or pink MOST form) -       Extended Emergency Contact Information Primary Emergency Contact: Dunford,Suzanne Address: [address]          [place], [place] [id number] [place] Home Phone: [phone] Mobile Phone: [phone] Relation: Daughter Secondary Emergency Contact: Jennye Boroughsichardson,Jami          [place], [place] [address] [place] Home Phone: [phone] Relation: Daughter   No Known Allergies  Chief Complaint  Patient presents with  . New Admit To SNF    New Admission     HPI:  Patient is a 80 y.o. female seen today for short term rehabilitation post hospital admission from 07/11/16-07/13/16 with right hip OA. She underwent right hip arthroplasty. she is seen in her room today. Pain medication has been helping keep her pain under control.   Review of Systems:  Constitutional: Negative for fever, chills, diaphoresis. Energy level is slowly coming back. HENT: Negative for headache, congestion, nasal discharge. Eyes: Negative for blurred vision, double vision and discharge.  Respiratory: Negative for cough, shortness of breath and wheezing.   Cardiovascular: Negative for chest pain, palpitations, leg swelling.  Gastrointestinal: Negative for heartburn, nausea, vomiting, abdominal pain. Last bowel movement was the night before surgery. Genitourinary: Negative for dysuria and flank pain.  Musculoskeletal: Negative for back pain, fall in the facility.  Skin: Negative for itching, rash.    Neurological: Negative for dizziness. Psychiatric/Behavioral: Negative for depression   Past Medical History:  Diagnosis Date  . Arthritis    OA - L hip  . Depression    situational- concerns about her weight and health  . Edema of both legs    ankles swell- pt attributes edema to her weight  . GERD (gastroesophageal reflux disease)   . Headache(784.0)    Sinsus  . Heart murmur   . History of blood transfusion    as a child, transfusion post tonsillectomy- also states that she has areas inher nose that has bleeding tendencies   . History of hiatal hernia   . Hypertension   . Seasonal allergies   . 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     Past Surgical History:  Procedure Laterality Date  . ABDOMINAL HYSTERECTOMY  2006  . APPENDECTOMY  1966  . BACK SURGERY    . CESAREAN SECTION     x 2  . Colonscopy    . EYE SURGERY     bil eyes  . Goiter    . Goiter  2008   Benign  . LUMBAR LAMINECTOMY/DECOMPRESSION MICRODISCECTOMY  08/19/2012   Procedure: LUMBAR LAMINECTOMY/DECOMPRESSION MICRODISCECTOMY 3 LEVELS;  Surgeon: Hewitt Shortsobert W Nudelman, MD;  Location: [facility];  Service: Neurosurgery;  Laterality: N/A;  Lumbar two-three, Lumbar three-four,Lumbar four-five laminectomies  . TONSILLECTOMY  1948  . TOTAL HIP ARTHROPLASTY Left 08/08/2015   Procedure: LEFT TOTAL HIP ARTHROPLASTY ANTERIOR APPROACH;  Surgeon: Kathryne Hitchhristopher Y Blackman, MD;  Location: [facility] OR;  Service: Orthopedics;  Laterality: Left;  .  TOTAL HIP ARTHROPLASTY Right 07/11/2016   Procedure: RIGHT TOTAL HIP ARTHROPLASTY ANTERIOR APPROACH;  Surgeon: Kathryne Hitchhristopher Y Blackman, MD;  Location: [facility]S;  Service: Orthopedics;  Laterality: Right;   Social History:   reports that she quit smoking about 19 months ago. Her smoking use included E-cigarettes. She has a 56.00 pack-year smoking history. She has never used smokeless tobacco. She reports that she does not drink alcohol or use drugs.  No  family history on file.  Medications:   Medication List       Accurate as of 07/16/16  1:58 PM. Always use your most recent med list.          aspirin 325 MG EC tablet Take 1 tablet (325 mg total) by mouth daily.   diphenhydrAMINE 25 mg capsule Commonly known as:  BENADRYL Take 25 mg by mouth at bedtime.   FLUoxetine 20 MG capsule Commonly known as:  PROZAC Take 1 capsule by mouth daily.   furosemide 20 MG tablet Commonly known as:  LASIX Take 1 tablet by mouth daily.   hydrochlorothiazide 25 MG tablet Commonly known as:  HYDRODIURIL Take 25 mg by mouth daily.   HYDROcodone-acetaminophen 5-325 MG tablet Commonly known as:  NORCO/VICODIN Take 1-2 tablets by mouth every 4 (four) hours as needed for moderate pain.   methocarbamol 500 MG tablet Commonly known as:  ROBAXIN Take 1 tablet (500 mg total) by mouth every 6 (six) hours as needed for muscle spasms.   polyethylene glycol packet Commonly known as:  MIRALAX / GLYCOLAX Take 17 g by mouth 2 (two) times daily.   potassium chloride 10 MEQ tablet Commonly known as:  K-DUR Take 1 tablet by mouth daily.   senna 8.6 MG tablet Commonly known as:  SENOKOT Take 2 tablets by mouth 2 (two) times daily.       Immunizations:  There is no immunization history on file for this patient.   Physical Exam:  Vitals:   07/16/16 1354  BP: (!) 126/53  Pulse: 73  Resp: 18  Temp: 97.7 F (36.5 C)  TempSrc: Oral  SpO2: 96%  Weight: 200 lb (90.7 kg)  Height: 5\' 4"  (1.626 m)   Body mass index is 34.33 kg/m.  General- elderly female, obese, in no acute distress Head- normocephalic, atraumatic Throat- moist mucus membrane Eyes- PERRLA, EOMI, no pallor, no icterus Neck- no cervical lymphadenopathy Cardiovascular- normal s1,s2, + murmur, 1+ right and trace left leg edema Respiratory- bilateral clear to auscultation, no wheeze, no rhonchi, no crackles, no use of accessory muscles Abdomen- bowel sounds present, soft,  non tender Musculoskeletal- able to move all 4 extremities, limited right hip range of motion Neurological- alert and oriented to person, place and time Skin- warm and dry, right hip surgical incision with aquacel dressing with minimal drainage Psychiatry- normal mood and affect    Labs reviewed: Basic Metabolic Panel:  Recent Labs  40/34/7408/06/16 0429 07/04/16 1330 07/12/16 0401  NA 137 138 138  K 4.1 4.0 3.3*  CL 101 97* 98*  CO2 26 31 33*  GLUCOSE 173* 84 133*  BUN 20 22* 13  CREATININE 1.08* 1.11* 0.97  CALCIUM 8.2* 9.2 8.2*   Liver Function Tests: No results for input(s): AST, ALT, ALKPHOS, BILITOT, PROT, ALBUMIN in the last 8760 hours. No results for input(s): LIPASE, AMYLASE in the last 8760 hours. No results for input(s): AMMONIA in the last 8760 hours. CBC:  Recent Labs  07/04/16 1330 07/12/16 0401 07/13/16 0458  WBC 8.0 9.4 12.4*  HGB 12.4 9.5* 9.4*  HCT 37.1 28.3* 27.7*  MCV 94.4 96.6 97.5  PLT 255 209 239   Cardiac Enzymes: No results for input(s): CKTOTAL, CKMB, CKMBINDEX, TROPONINI in the last 8760 hours. BNP: Invalid input(s): POCBNP CBG: No results for input(s): GLUCAP in the last 8760 hours.  Radiological Exams: Dg C-arm 1-60 Min-no Report  Result Date: 07/11/2016 CLINICAL DATA: hip C-ARM 1-60 MINUTES Fluoroscopy was utilized by the requesting physician.  No radiographic interpretation.   Dg Hip Port Unilat With Pelvis 1v Right  Result Date: 07/11/2016 CLINICAL DATA:  Status post right hip replacement EXAM: DG HIP (WITH OR WITHOUT PELVIS) 1V PORT RIGHT COMPARISON:  06/03/2016 FINDINGS: Bilateral hip replacements are noted. No acute bony or soft tissue abnormality is seen. IMPRESSION: Status post right hip replacement.  No acute abnormality noted. Electronically Signed   By: Alcide Clever M.D.   On: 07/11/2016 09:56   Dg Hip Unilat With Pelvis 2-3 Views Right  Result Date: 07/11/2016 CLINICAL DATA:  Right total hip arthroplasty EXAM: DG C-ARM 1-60  MIN-NO REPORT; DG HIP (WITH OR WITHOUT PELVIS) 2-3V RIGHT COMPARISON:  06/03/2016 pelvic and right hip radiograph FINDINGS: Fluoroscopy time 0.5 minutes. Two spot fluoroscopic nondiagnostic intraoperative radiographs demonstrate postsurgical changes from interval right total hip arthroplasty. IMPRESSION: Intraoperative fluoroscopic guidance for right total hip arthroplasty. Electronically Signed   By: Delbert Phenix M.D.   On: 07/11/2016 08:48    Assessment/Plan  Unsteady gait With right hip surgical repair. Will have patient work with PT/OT as tolerated to regain strength and restore function.  Fall precautions are in place.  Right hip OA S/p right hip arthroplasty. Has orthopedic follow up. RLE WBAT. Continue norco 5-325 mg 1-2 tab q4h prn pain. D/c aleve. Continue robaxin 500 mg q6h prn muscle spasm. Will have her work with physical therapy and occupational therapy team to help with gait training and muscle strengthening exercises.fall precautions. Skin care. Encourage to be out of bed. Continue aspirin 325 mg daily for dvt prophylaxis.   Blood loss anemia Post op, monitor cbc.   Leukocytosis Afebrile, monitor wbc and temp curve  Constipation On senokot 2 tab bid, miralax 17 g bid recently started. Add prune juice and give dulcolax suppository  Hypokalemia Monitor bmp. Continue kcl 10 meq daily  HTN Monitor bp reading, continue hctz 25 mg daily, check bmp  Leg edema Continue lasix 20 mg daily and check bmp. Add ted hose. Continue kcl  Chronic depression Stable, continue fluoxetine for now    Goals of care: short term rehabilitation   Labs/tests ordered: cbc, bmp  Family/ staff Communication: reviewed care plan with patient and nursing supervisor    Oneal Grout, MD Internal Medicine [facility] [address] [place], [place] [address] Cell Phone (Monday-Friday 8 am - 5 pm): [phone] On Call: [phone] and follow prompts after  5 pm and on weekends Office Phone: [phone] Office Fax: [phone]   ADDENDUM:  Labs resulted from this am. Cbc shows drop in Hb. Will start her on feso4 325 mg daily. Guaiac stool x 3 now and start her on protonix 20 mg daily for gi prophylaxis x 1 week  Lab result shows hypokalemia. She is currently on kcl 10 meq daily and her k is 2.8. No symptom. Provide kcl 80 meq po x 1 now and 40 meq bid x 3 days from tomorrow. Check bmp and mg level in am  CBC Latest Ref Rng & Units 07/16/2016 07/13/2016 07/12/2016  WBC 10:3/mL 7.4 12.4(H) 9.4  Hemoglobin 12.0 - 16.0 g/dL 8.7(A) 9.4(L) 9.5(L)  Hematocrit 36 - 46 % 26(A) 27.7(L) 28.3(L)  Platelets 150 - 399 K/L 263 239 209   CMP Latest Ref Rng & Units 07/16/2016 07/12/2016 07/04/2016  Glucose 65 - 99 mg/dL - 952(W) 84  BUN 4 - 21 mg/dL 41(L) 13 24(M)  Creatinine 0.5 - 1.1 mg/dL 0.9 0.10 2.72(Z)  Sodium 137 - 147 mmol/L 141 138 138  Potassium 3.4 - 5.3 mmol/L 2.9(A) 3.3(L) 4.0  Chloride 101 - 111 mmol/L - 98(L) 97(L)  CO2 22 - 32 mmol/L - 33(H) 31  Calcium 8.9 - 10.3 mg/dL - 8.2(L) 9.2  Alkaline Phos 25 - 125 U/L 45 - -  AST 13 - 35 U/L 17 - -  ALT 7 - 35 U/L 13 - -     Oneal Grout, MD Internal Medicine [facility] [address] [place], [place] [address] Cell Phone (Monday-Friday 8 am - 5 pm): [phone] On Call: [phone] and follow prompts after 5 pm and on weekends Office Phone: [phone] Office Fax: [phone]

## 2016-07-22 LAB — CBC AND DIFFERENTIAL
HEMATOCRIT: 29 % — AB (ref 36–46)
HEMOGLOBIN: 9.4 g/dL — AB (ref 12.0–16.0)
Neutrophils Absolute: 4 /uL
PLATELETS: 285 10*3/uL (ref 150–399)
WBC: 7 10*3/mL

## 2016-07-22 LAB — BASIC METABOLIC PANEL
BUN: 18 mg/dL (ref 4–21)
CREATININE: 0.9 mg/dL (ref 0.5–1.1)
GLUCOSE: 95 mg/dL
Potassium: 4.1 mmol/L (ref 3.4–5.3)
Sodium: 142 mmol/L (ref 137–147)

## 2016-07-25 ENCOUNTER — Encounter: Payer: Self-pay | Admitting: Adult Health

## 2016-07-25 NOTE — Progress Notes (Signed)
This encounter was created in error - please disregard.

## 2016-07-31 ENCOUNTER — Non-Acute Institutional Stay (SKILLED_NURSING_FACILITY): Payer: Medicare HMO | Admitting: Adult Health

## 2016-07-31 ENCOUNTER — Encounter: Payer: Self-pay | Admitting: Adult Health

## 2016-07-31 DIAGNOSIS — M1611 Unilateral primary osteoarthritis, right hip: Secondary | ICD-10-CM

## 2016-07-31 DIAGNOSIS — D62 Acute posthemorrhagic anemia: Secondary | ICD-10-CM

## 2016-07-31 DIAGNOSIS — I10 Essential (primary) hypertension: Secondary | ICD-10-CM | POA: Diagnosis not present

## 2016-07-31 DIAGNOSIS — R2681 Unsteadiness on feet: Secondary | ICD-10-CM | POA: Diagnosis not present

## 2016-07-31 DIAGNOSIS — R600 Localized edema: Secondary | ICD-10-CM

## 2016-07-31 DIAGNOSIS — F329 Major depressive disorder, single episode, unspecified: Secondary | ICD-10-CM

## 2016-07-31 DIAGNOSIS — E876 Hypokalemia: Secondary | ICD-10-CM

## 2016-07-31 DIAGNOSIS — K5901 Slow transit constipation: Secondary | ICD-10-CM | POA: Diagnosis not present

## 2016-07-31 NOTE — Progress Notes (Signed)
Patient ID: Amanda Ponce, female   DOB: [birth year], 81 y.o.   MRN: [id number]    DATE:  07/31/2016   MRN:  [id number]  BIRTHDAY: 1936-06-24  Facility:  Nursing Home Location:  [name]  Nursing Home Room Number: 1107-P  LEVEL OF CARE:  SNF [phone])  Contact Information    Name Relation Home Work Mobile   Dunford,Suzanne Daughter [phone]  [phone]   Richardson,Jami Daughter [phone]     Dia Sitter Daughter [phone]     Roberts,Debbie Daughter   [phone]       Code Status History    Date Active Date Inactive Code Status Order ID Comments User Context   07/11/2016 10:21 AM 07/13/2016  7:42 PM Full Code [id number]  Kathryne Hitch, MD Inpatient   08/08/2015  4:18 PM 08/11/2015  5:13 PM Full Code [id number]  Kathryne Hitch, MD Inpatient   08/19/2012 12:45 PM 08/20/2012  2:05 PM Full Code [id number]  Vincente Liberty, RN Inpatient       Chief Complaint  Patient presents with  . Discharge Note    HISTORY OF PRESENT ILLNESS:  This is a 80 year old female who is for discharge home with Home health PT, OT, CNA and Nursing for medication management. She is going home with medications and prescriptions. DME:  Rolling walker  [facility] had right total hip replacement on 07/11/16.  Patient was admitted to this facility for short-term rehabilitation after the patient's recent hospitalization.  Patient has completed SNF rehabilitation and therapy has cleared the patient for discharge.   PAST MEDICAL HISTORY:  Past Medical History:  Diagnosis Date  . Arthritis    OA - L hip  . Depression    situational- concerns about her weight and health  . Edema of both legs    ankles swell- pt attributes edema to her weight  . GERD (gastroesophageal reflux disease)   . Headache(784.0)    Sinsus  . Heart murmur   . History of blood  transfusion    as a child, transfusion post tonsillectomy- also states that she has areas inher nose that has bleeding tendencies   . History of hiatal hernia   . Hypertension   . Seasonal allergies   . 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       CURRENT MEDICATIONS: Reviewed  Patient's Medications  New Prescriptions   No medications on file  Previous Medications   AMINO ACIDS-PROTEIN HYDROLYS (FEEDING SUPPLEMENT, PRO-STAT SUGAR FREE 64,) LIQD    Take 30 mLs by mouth 2 (two) times daily with a meal.   ASPIRIN EC 325 MG EC TABLET    Take 1 tablet (325 mg total) by mouth daily.   BISACODYL (DULCOLAX) 10 MG SUPPOSITORY    Place 10 mg rectally daily as needed for moderate constipation.   DIPHENHYDRAMINE (BENADRYL) 25 MG CAPSULE    Take 25 mg by mouth at bedtime.   FERROUS SULFATE 325 (65 FE) MG TABLET    Take 325 mg by mouth daily with breakfast.   FLUOXETINE (PROZAC) 20 MG CAPSULE    Take 1 capsule by mouth daily.    FUROSEMIDE (LASIX) 20 MG TABLET    Take 1 tablet by mouth daily.    HYDROCHLOROTHIAZIDE (HYDRODIURIL) 25 MG TABLET    Take 25 mg by mouth daily.  HYDROCODONE-ACETAMINOPHEN (NORCO/VICODIN) 5-325 MG TABLET    Take 1-2 tablets by mouth every 4 (four) hours as needed for moderate pain.   METHOCARBAMOL (ROBAXIN) 500 MG TABLET    Take 1 tablet (500 mg total) by mouth every 6 (six) hours as needed for muscle spasms.   POLYETHYLENE GLYCOL (MIRALAX / GLYCOLAX) PACKET    Take 17 g by mouth 2 (two) times daily.   POTASSIUM CHLORIDE (KLOR-CON) 20 MEQ PACKET    Take 20 mEq by mouth 2 (two) times daily.  Modified Medications   No medications on file  Discontinued Medications   No medications on file     No Known Allergies   REVIEW OF SYSTEMS:  GENERAL: no change in appetite, no fatigue, no weight changes, no fever, chills or weakness EYES: Denies change in vision, dry eyes, eye pain, itching or discharge EARS: Denies change in hearing,  ringing in ears, or earache NOSE: Denies nasal congestion or epistaxis MOUTH and THROAT: Denies oral discomfort, gingival pain or bleeding, pain from teeth or hoarseness   RESPIRATORY: no cough, SOB, DOE, wheezing, hemoptysis CARDIAC: no chest pain, or palpitations GI: no abdominal pain, diarrhea, heart burn, nausea or vomiting, + constipation GU: Denies dysuria, frequency, hematuria, incontinence, or discharge PSYCHIATRIC: Denies feeling of depression or anxiety. No report of hallucinations, insomnia, paranoia, or agitation    PHYSICAL EXAMINATION  GENERAL APPEARANCE: Well nourished. In no acute distress. Obese SKIN:  Right hip surgical incision has steri-strips, dry, no erythema HEAD: Normal in size and contour. No evidence of trauma EYES: Lids open and close normally. No blepharitis, entropion or ectropion. PERRL. Conjunctivae are clear and sclerae are white. Lenses are without opacity EARS: Pinnae are normal. Patient hears normal voice tunes of the examiner MOUTH and THROAT: Lips are without lesions. Oral mucosa is moist and without lesions. Tongue is normal in shape, size, and color and without lesions NECK: supple, trachea midline, no neck masses, no thyroid tenderness, no thyromegaly LYMPHATICS: no LAN in the neck, no supraclavicular LAN RESPIRATORY: breathing is even & unlabored, BS CTAB CARDIAC: RRR, no murmur,no extra heart sounds, BLE 2+ edema GI: abdomen soft, normal BS, no masses, no tenderness, no hepatomegaly, no splenomegaly EXTREMITIES:  Able to move X 4 extremities PSYCHIATRIC: Alert and oriented X 3. Affect and behavior are appropriate  LABS/RADIOLOGY: Labs reviewed: Basic Metabolic Panel:  Recent Labs  96/29/52 0429 07/04/16 1330 07/12/16 0401 07/16/16 07/22/16 1358  NA 137 138 138 141 142  K 4.1 4.0 3.3* 2.9* 4.1  CL 101 97* 98*  --   --   CO2 26 31 33*  --   --   GLUCOSE 173* 84 133*  --   --   BUN 20 22* 13 26* 18  CREATININE 1.08* 1.11* 0.97 0.9 0.9   CALCIUM 8.2* 9.2 8.2*  --   --    CBC:  Recent Labs  07/04/16 1330 07/12/16 0401 07/13/16 0458 07/16/16 07/22/16 1358  WBC 8.0 9.4 12.4* 7.4 7.0  NEUTROABS  --   --   --   --  4  HGB 12.4 9.5* 9.4* 8.7* 9.4*  HCT 37.1 28.3* 27.7* 26* 29*  MCV 94.4 96.6 97.5  --   --   PLT 255 209 239 263 285    Dg C-arm 1-60 Min-no Report  Result Date: 07/11/2016 CLINICAL DATA: hip C-ARM 1-60 MINUTES Fluoroscopy was utilized by the requesting physician.  No radiographic interpretation.   Dg Hip Port Unilat With Pelvis 1v Right  Result  Date: 07/11/2016 CLINICAL DATA:  Status post right hip replacement EXAM: DG HIP (WITH OR WITHOUT PELVIS) 1V PORT RIGHT COMPARISON:  06/03/2016 FINDINGS: Bilateral hip replacements are noted. No acute bony or soft tissue abnormality is seen. IMPRESSION: Status post right hip replacement.  No acute abnormality noted. Electronically Signed   By: Alcide CleverMark  Lukens M.D.   On: 07/11/2016 09:56   Dg Hip Unilat With Pelvis 2-3 Views Right  Result Date: 07/11/2016 CLINICAL DATA:  Right total hip arthroplasty EXAM: DG C-ARM 1-60 MIN-NO REPORT; DG HIP (WITH OR WITHOUT PELVIS) 2-3V RIGHT COMPARISON:  06/03/2016 pelvic and right hip radiograph FINDINGS: Fluoroscopy time 0.5 minutes. Two spot fluoroscopic nondiagnostic intraoperative radiographs demonstrate postsurgical changes from interval right total hip arthroplasty. IMPRESSION: Intraoperative fluoroscopic guidance for right total hip arthroplasty. Electronically Signed   By: Delbert PhenixJason A Poff M.D.   On: 07/11/2016 08:48    ASSESSMENT/PLAN:  Unsteady gait - for Home health PT, CNA, Nursing and OT, for therapeutic strengthening exercises; fall precaution   Right hip osteoarthritis S/P right total hip replacement - for Home health PT and OT; follow-up with orthopedic surgeon, Dr. Doneen Poissonhristopher Blackman; continue aspirin 325 mg EC 1 tab by mouth daily  Till 07/31/16 then discontinue  And continue ted stockings, knee high for DVT prophylaxis;  Norco 5/325 mg 1-2 tabs by mouth every 4 hours when necessary for pain; Robaxin 500 mg 1 tab by mouth every 6 hours when necessary for muscle spasm  Depression - mood is stable; continue Prozac 20 mg 1 capsule by mouth daily  Hypokalemia - continue KCL ER 20 meq 1 tab by mouth daily Lab Results  Component Value Date   K 4.1 07/22/2016   Anemia of chronic disease - stable; was recently started on FeSo4 325 mg daily  Lab Results  Component Value Date   HGB 9.4 (A) 07/22/2016    Lower extremity edema, bilateral - continue Lasix 20 mg 1 tab by mouth daily  Hypertension - well-controlled; continue hydrochlorothiazide 25 mg 1 tab by mouth daily  Leukocytosis - resolved Lab Results  Component Value Date   WBC 7.0 07/22/2016   Constipation - continue senna S 8.6-50 mg 2 tabs by mouth twice a day and MiraLAX 17 g by mouth twice a day      I have filled out patient's discharge paperwork and written prescriptions.  Patient will receive home health PT, OT, Nursing and CNA.  DME provided:  Rolling walker  Total discharge time: Greater than 30 minutes  Discharge time involved coordination of the discharge process with Child psychotherapistsocial worker, nursing staff and therapy department. Medical justification for home health services/DME verified.     Kenard GowerMonina Medina-Vargas, NP [facility] [phone]

## 2016-08-02 ENCOUNTER — Other Ambulatory Visit: Payer: Self-pay | Admitting: Adult Health

## 2016-08-02 ENCOUNTER — Other Ambulatory Visit: Payer: Self-pay | Admitting: Nurse Practitioner

## 2016-09-19 ENCOUNTER — Ambulatory Visit (INDEPENDENT_AMBULATORY_CARE_PROVIDER_SITE_OTHER): Payer: Medicare HMO | Admitting: Orthopaedic Surgery

## 2016-09-19 DIAGNOSIS — M25552 Pain in left hip: Secondary | ICD-10-CM

## 2016-09-19 DIAGNOSIS — M1612 Unilateral primary osteoarthritis, left hip: Secondary | ICD-10-CM

## 2016-09-19 DIAGNOSIS — M1611 Unilateral primary osteoarthritis, right hip: Secondary | ICD-10-CM

## 2016-10-13 IMAGING — RF DG HIP (WITH PELVIS) OPERATIVE*L*
1 series · 2 of 2 positions shown · non-contrast
Comparison: Bone window images from CT abdomen and pelvis
[DATE].

CLINICAL DATA: Left total hip arthroplasty for osteoarthritis.

EXAM:
OPERATIVE LEFT HIP (WITH PELVIS IF PERFORMED) 1 VIEWS
TECHNIQUE: Fluoroscopic spot image(s) were submitted for interpretation
post-operatively.

[Series 1: run · 2 of 2 slices shown]
[im 1/2]
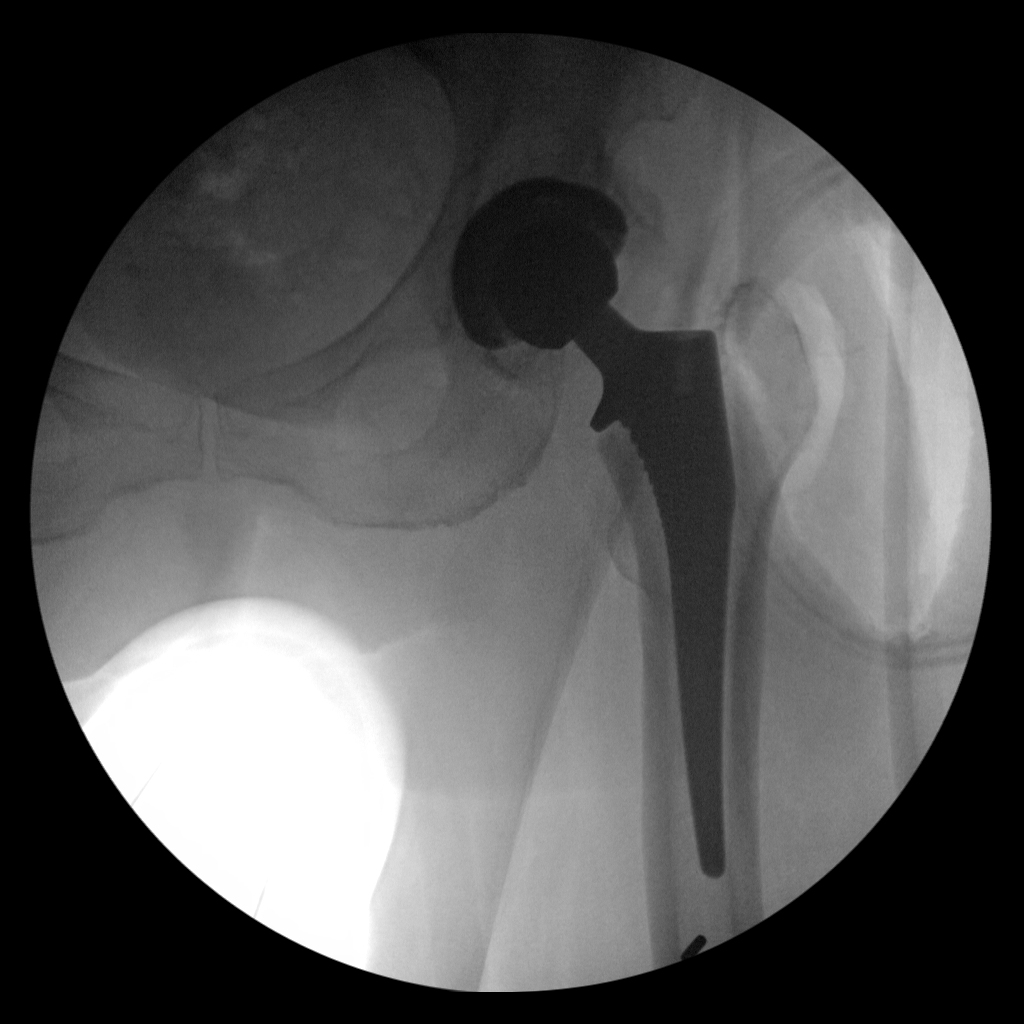
[im 2/2]
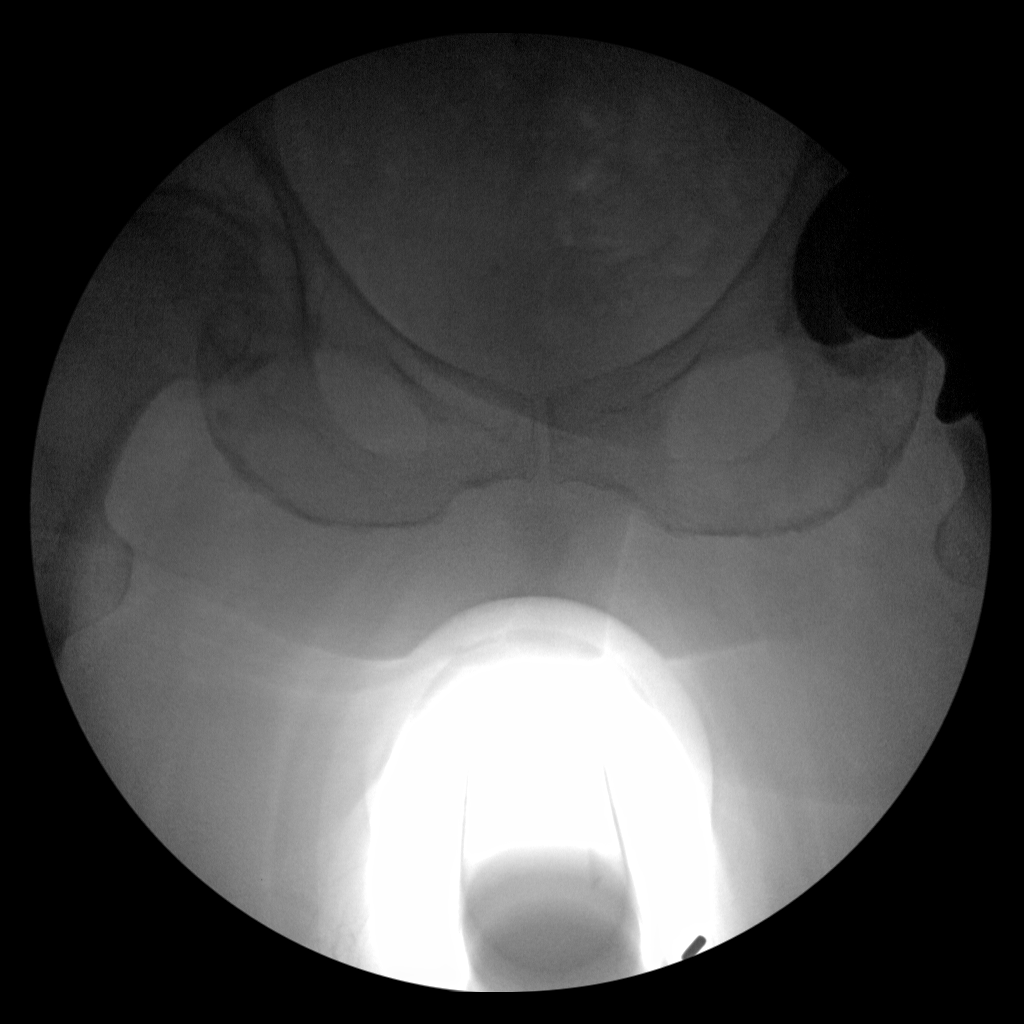

[2 of 2 positions shown; findings below may reference images not displayed]

FINDINGS: Two spot images were obtained, AP views of the pelvis, demonstrating
left total hip arthroplasty with anatomic alignment. No acute
complicating features.

The radiologic technologist documented 35 sec of fluoroscopy time.
IMPRESSION: Anatomic alignment post left total hip arthroplasty without acute
complicating features.

## 2016-10-13 IMAGING — DX DG HIP (WITH OR WITHOUT PELVIS) 1V PORT*L*
2 series · 2 of 2 positions shown · non-contrast
Comparison: Intraoperative images from 0508 hours today.

CLINICAL DATA: 79-year-old female status post left total hip
arthroplasty. Initial encounter.

EXAM:
DG HIP (WITH OR WITHOUT PELVIS) 1V PORT LEFT

[pelvis ap]
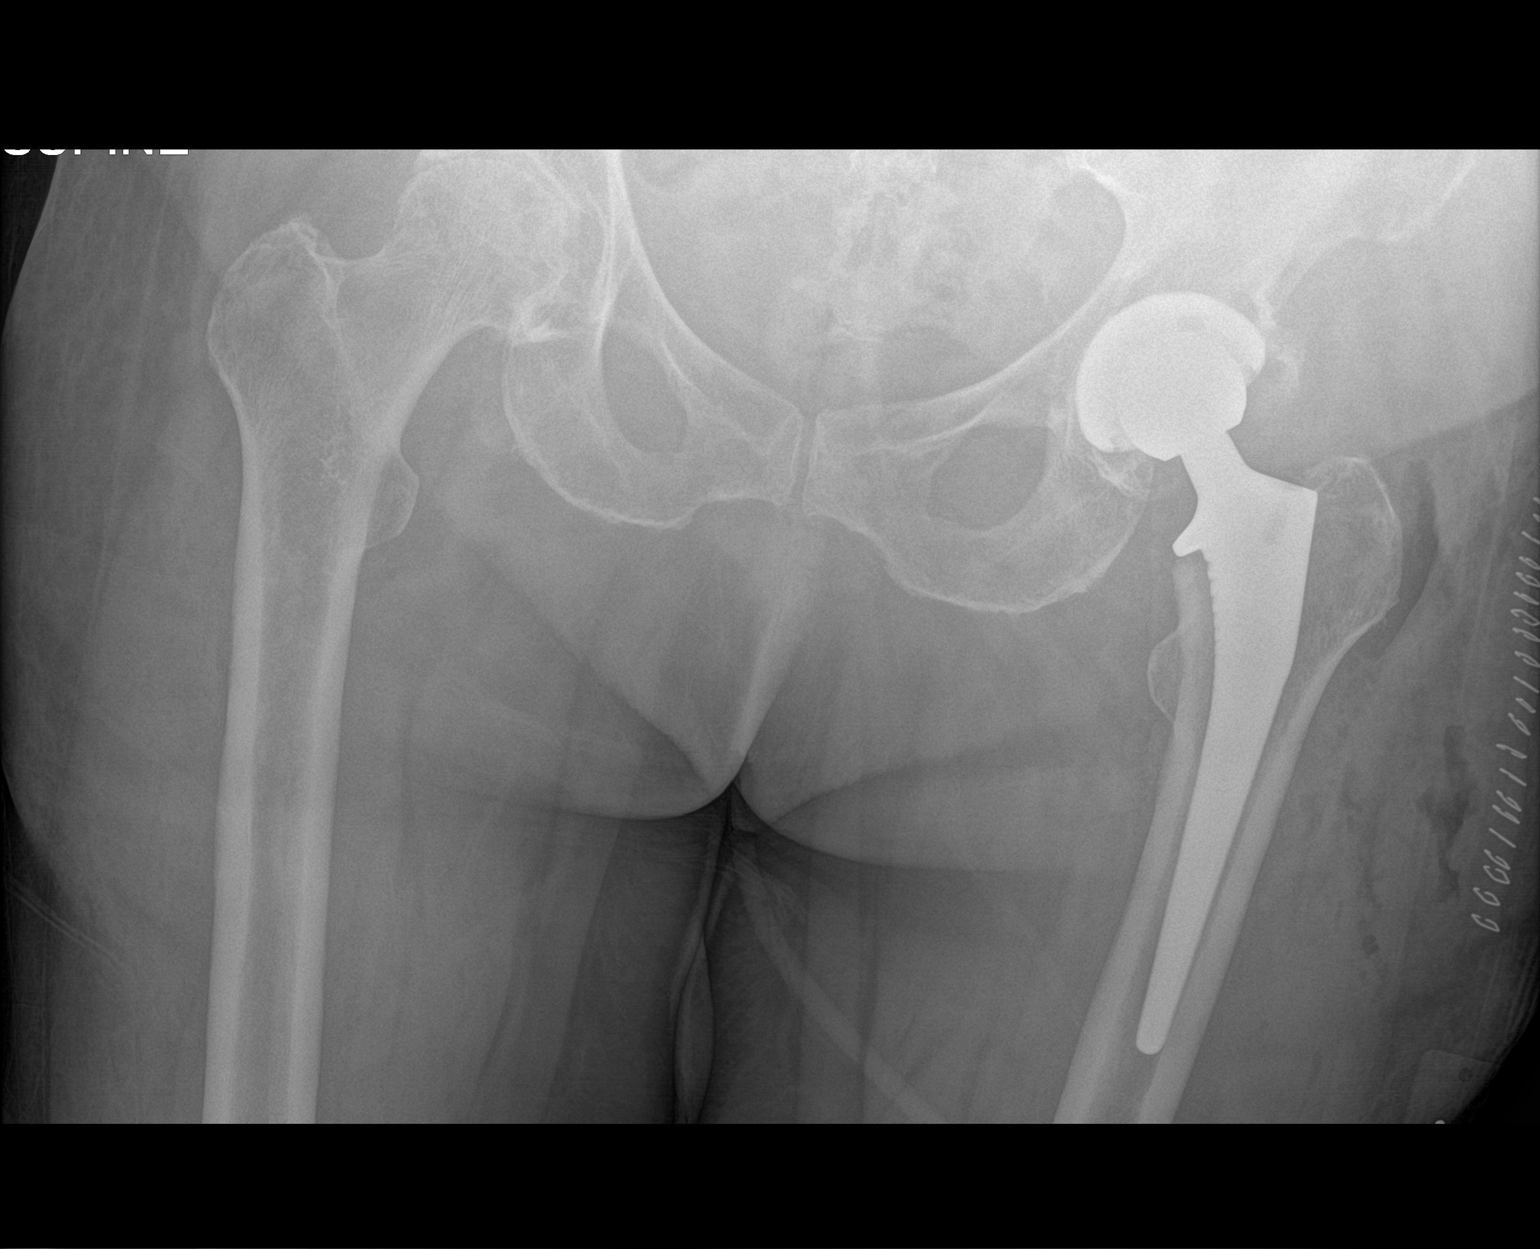

[x table lat]
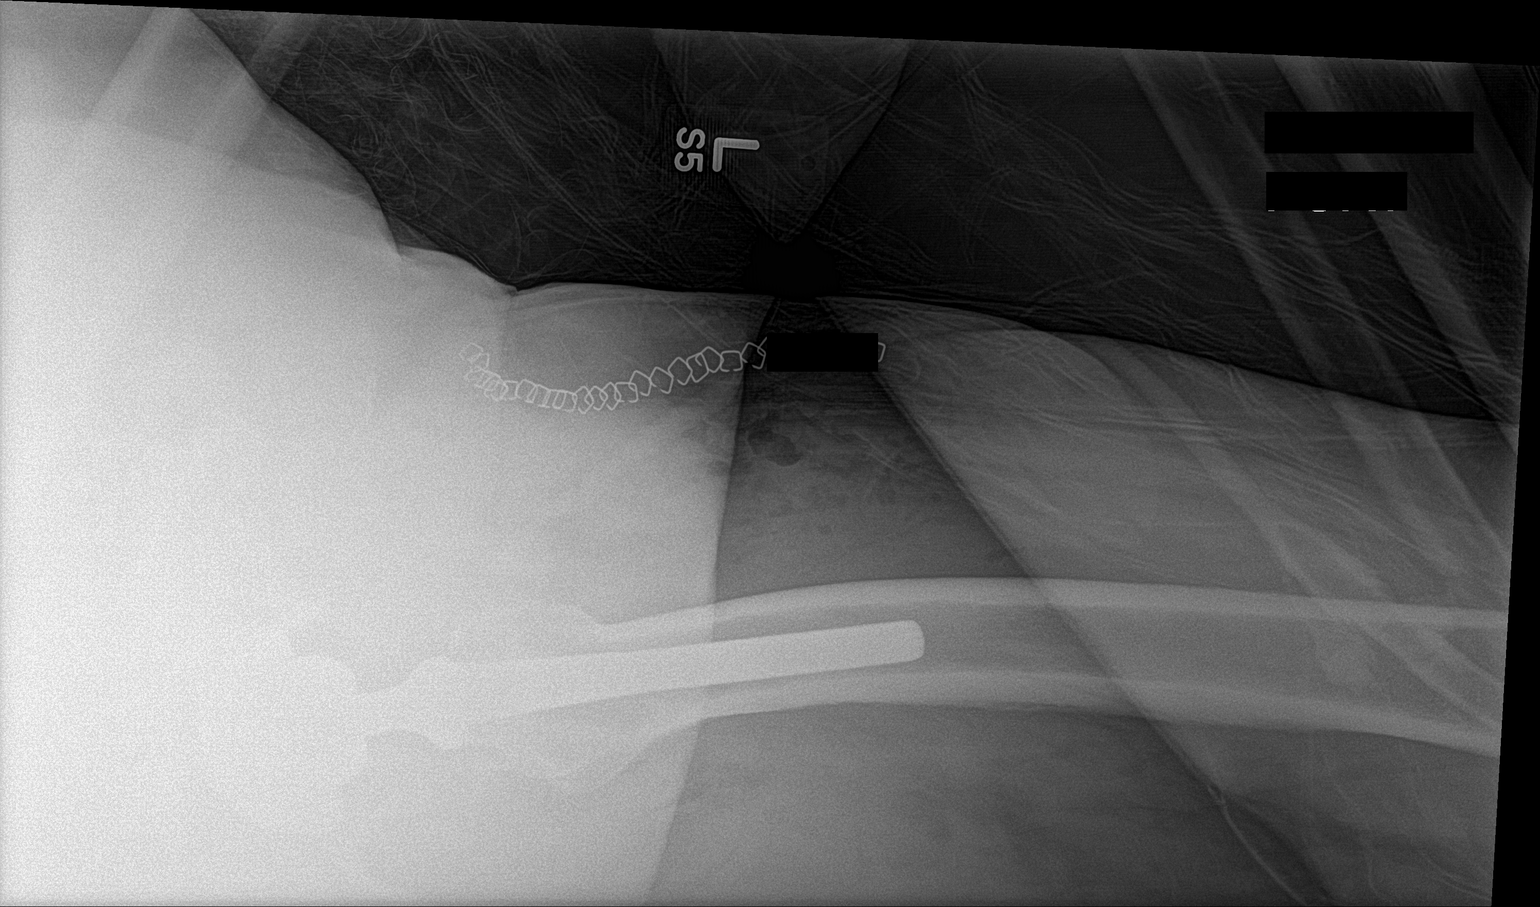

[2 of 2 positions shown; findings below may reference images not displayed]

FINDINGS: Portable supine AP and cross-table lateral views of the left hip.
Bipolar type hip arthroplasty components appear intact and normally
aligned. Surrounding postoperative changes including to the soft
tissues the subcutaneous gas and skin staples. Elsewhere the
visualized pelvis and proximal right femur appear intact. There is
right hip osteoarthritis with subchondral cysts.
IMPRESSION: 1. Left hip arthroplasty with no adverse features.
2. Right hip osteoarthritis.

## 2017-01-03 ENCOUNTER — Telehealth (INDEPENDENT_AMBULATORY_CARE_PROVIDER_SITE_OTHER): Payer: Self-pay | Admitting: Orthopaedic Surgery

## 2017-01-03 NOTE — Telephone Encounter (Signed)
Patient called requesting a refill of her methocarbamol.  [phone]#[phone].

## 2017-01-06 MED ORDER — METHOCARBAMOL 500 MG PO TABS: 500.0000 mg | ORAL_TABLET | Freq: Three times a day (TID) | ORAL | 0 refills | Status: DC | PRN

## 2017-01-06 NOTE — Telephone Encounter (Signed)
See message.

## 2017-01-06 NOTE — Telephone Encounter (Signed)
LMOM with details. Rx sent to pharm, should be ready for pick up

## 2017-01-06 NOTE — Telephone Encounter (Signed)
Please advise on Rf. See message below

## 2017-01-06 NOTE — Telephone Encounter (Signed)
Hopefully I just sent in some correctly on the computer to her [facility]

## 2017-03-20 ENCOUNTER — Ambulatory Visit (INDEPENDENT_AMBULATORY_CARE_PROVIDER_SITE_OTHER): Payer: Medicare HMO

## 2017-03-20 ENCOUNTER — Ambulatory Visit (INDEPENDENT_AMBULATORY_CARE_PROVIDER_SITE_OTHER): Payer: Medicare HMO | Admitting: Orthopaedic Surgery

## 2017-03-20 DIAGNOSIS — Z96641 Presence of right artificial hip joint: Secondary | ICD-10-CM

## 2017-03-20 DIAGNOSIS — M25551 Pain in right hip: Secondary | ICD-10-CM | POA: Diagnosis not present

## 2017-03-20 NOTE — Progress Notes (Signed)
The patient is following up after having both her hips replaced. The more recent right hip displaced 8 months ago I believe the left hip is about 19 months ago she is doing great. She has no issues with her hips.  On examination both hips have fluid range of motion without any difficulty at all. I see no other issues. Her leg lengths are equal.  A low AP pelvis shows well seated implants on both hips is getting features.  At this point she'll follow-up as needed in terms of her hips. If she has any issues with her hips or other problems she knows to give skull.

## 2017-04-17 ENCOUNTER — Other Ambulatory Visit: Payer: Self-pay | Admitting: Physician Assistant

## 2017-04-17 DIAGNOSIS — Z87891 Personal history of nicotine dependence: Secondary | ICD-10-CM

## 2017-05-25 LAB — GLUCOSE, POCT (MANUAL RESULT ENTRY): POC GLUCOSE: 166 mg/dL — AB (ref 70–99)

## 2017-06-06 ENCOUNTER — Ambulatory Visit
Admission: RE | Admit: 2017-06-06 | Discharge: 2017-06-06 | Disposition: A | Discharge status: Home / Self Care | Payer: Medicare HMO | Source: Ambulatory Visit | Attending: Physician Assistant | Admitting: Physician Assistant

## 2017-06-06 DIAGNOSIS — Z87891 Personal history of nicotine dependence: Secondary | ICD-10-CM

## 2017-09-16 IMAGING — DX DG HIP (WITH OR WITHOUT PELVIS) 1V PORT*R*
2 series · 2 of 2 positions shown · non-contrast
Comparison: 06/03/2016

CLINICAL DATA: Status post right hip replacement

EXAM:
DG HIP (WITH OR WITHOUT PELVIS) 1V PORT RIGHT

[pelvis ap]
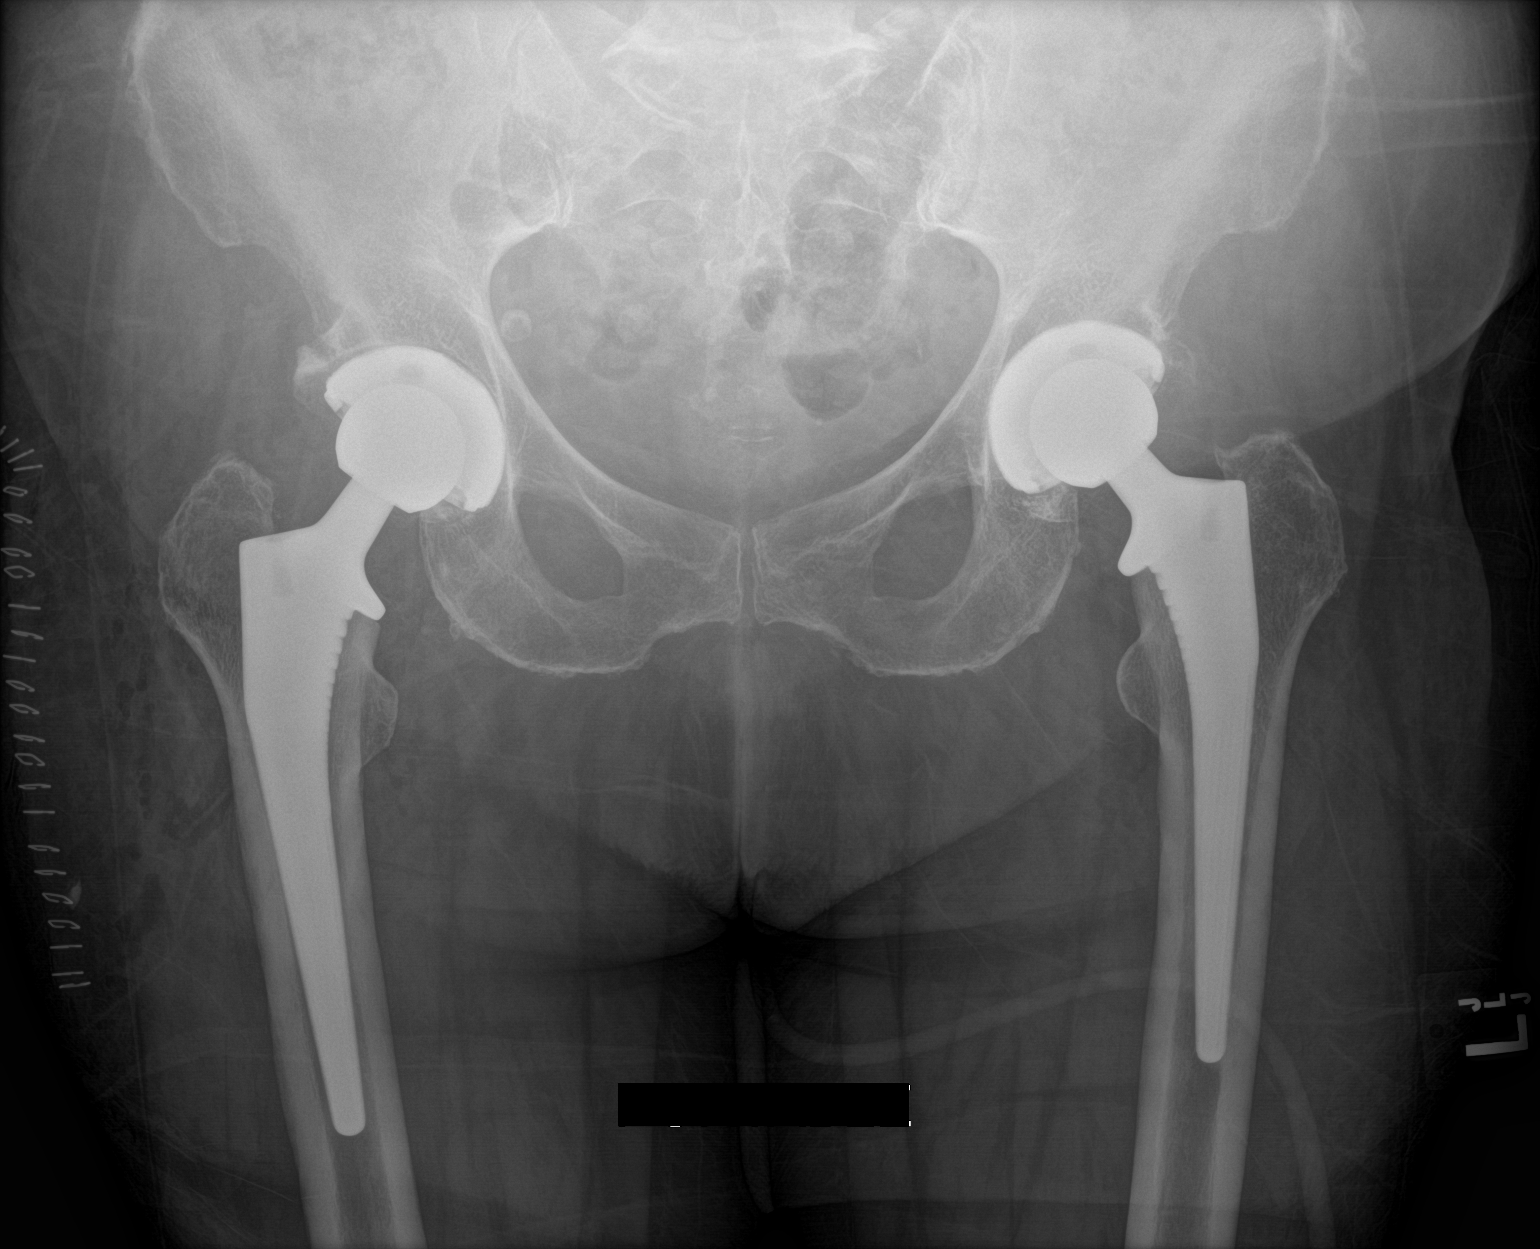

[hip lat]
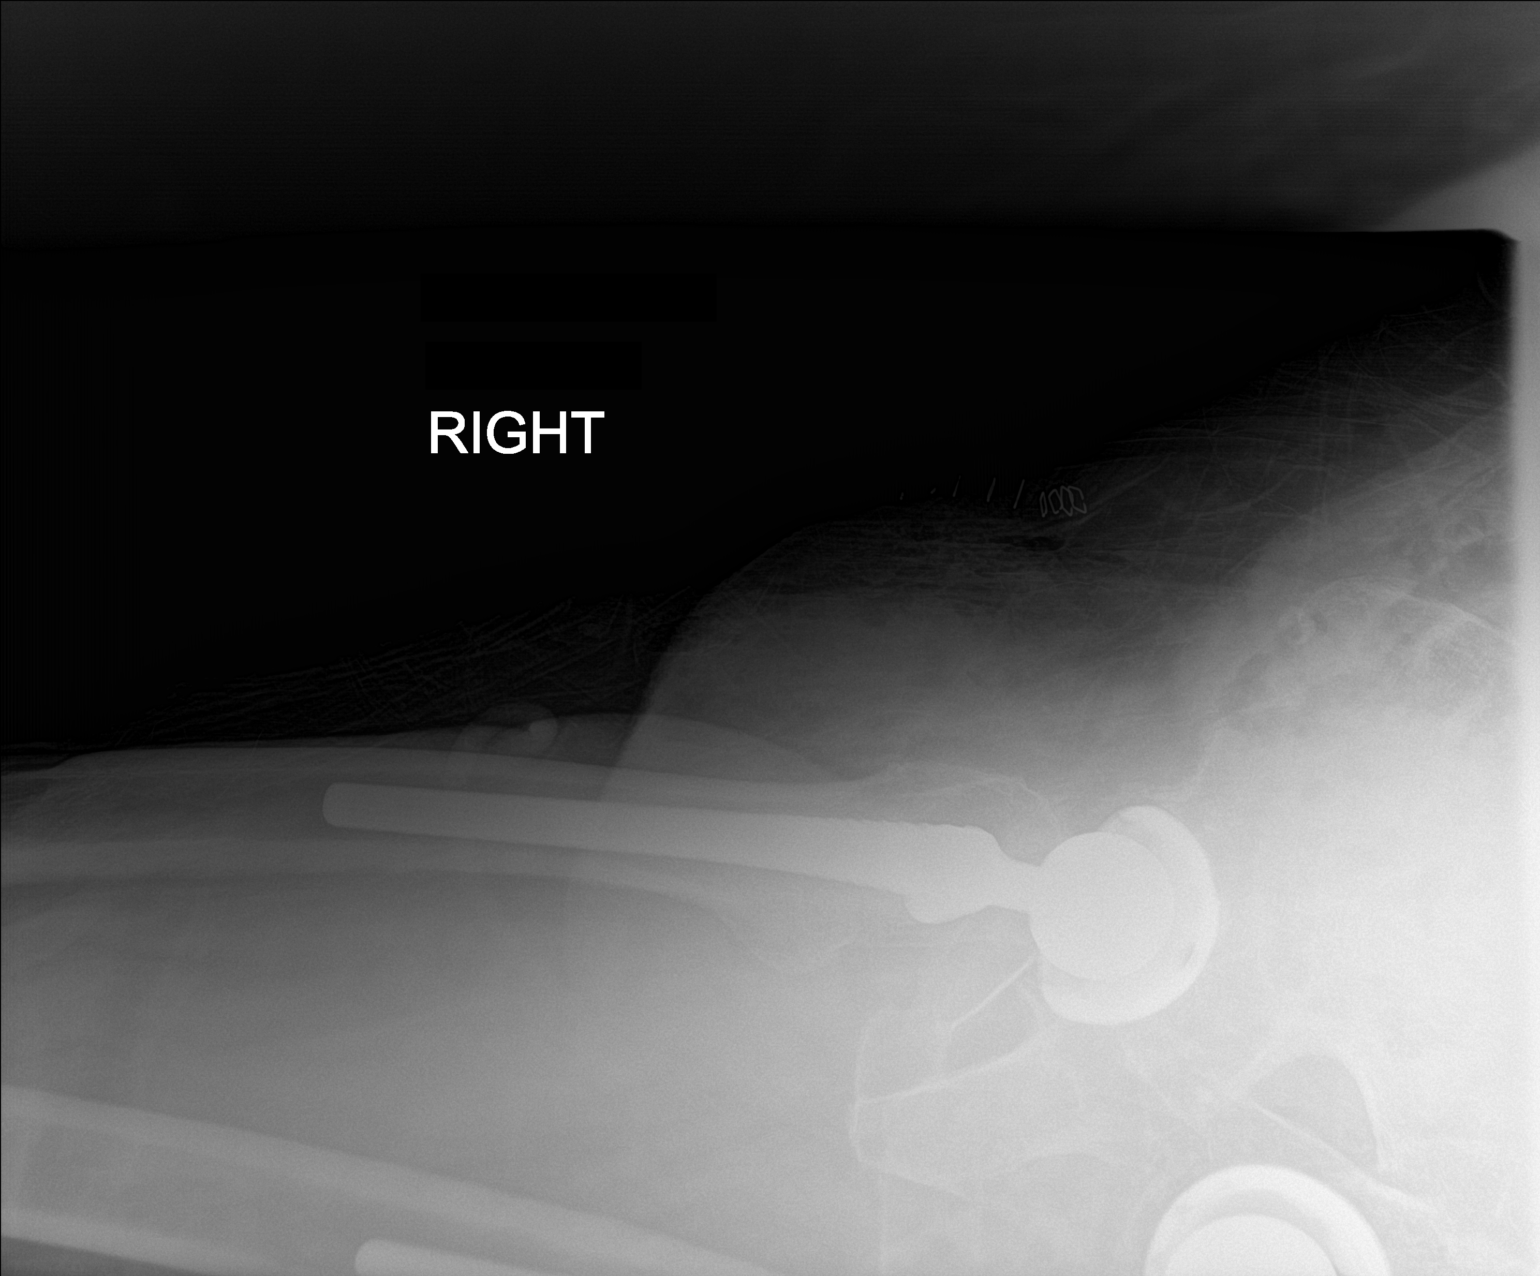

[2 of 2 positions shown; findings below may reference images not displayed]

FINDINGS: Bilateral hip replacements are noted. No acute bony or soft tissue
abnormality is seen.
IMPRESSION: Status post right hip replacement.  No acute abnormality noted.

## 2017-10-12 LAB — GLUCOSE, POCT (MANUAL RESULT ENTRY): POC Glucose: 115 mg/dl — AB (ref 70–99)

## 2017-10-31 ENCOUNTER — Other Ambulatory Visit: Payer: Self-pay | Admitting: Physician Assistant

## 2017-10-31 DIAGNOSIS — R9389 Abnormal findings on diagnostic imaging of other specified body structures: Secondary | ICD-10-CM

## 2017-11-06 ENCOUNTER — Ambulatory Visit
Admission: RE | Admit: 2017-11-06 | Discharge: 2017-11-06 | Disposition: A | Discharge status: Home / Self Care | Payer: Medicare HMO | Source: Ambulatory Visit | Attending: Physician Assistant | Admitting: Physician Assistant

## 2017-11-06 DIAGNOSIS — R9389 Abnormal findings on diagnostic imaging of other specified body structures: Secondary | ICD-10-CM

## 2017-11-06 MED ORDER — IOPAMIDOL (ISOVUE-300) INJECTION 61%
75.0000 mL | Freq: Once | INTRAVENOUS | Status: AC | PRN
Start: 2017-11-06 — End: 2017-11-06
  Administered 2017-11-06: 75 mL via INTRAVENOUS

## 2017-11-19 DIAGNOSIS — I728 Aneurysm of other specified arteries: Secondary | ICD-10-CM | POA: Insufficient documentation

## 2018-04-07 ENCOUNTER — Ambulatory Visit (INDEPENDENT_AMBULATORY_CARE_PROVIDER_SITE_OTHER): Payer: Medicare HMO | Admitting: Orthopaedic Surgery

## 2018-05-18 ENCOUNTER — Encounter (HOSPITAL_COMMUNITY): Payer: Self-pay | Admitting: Family Medicine

## 2018-05-18 ENCOUNTER — Ambulatory Visit (HOSPITAL_COMMUNITY)
Admission: EM | Admit: 2018-05-18 | Discharge: 2018-05-18 | Disposition: A | Discharge status: Home / Self Care | Payer: Medicare HMO | Attending: Internal Medicine | Admitting: Internal Medicine

## 2018-05-18 DIAGNOSIS — M79675 Pain in left toe(s): Secondary | ICD-10-CM | POA: Diagnosis not present

## 2018-05-18 MED ORDER — DEXAMETHASONE SODIUM PHOSPHATE 10 MG/ML IJ SOLN
10.0000 mg | Freq: Once | INTRAMUSCULAR | Status: AC
  Administered 2018-05-18: 10 mg via INTRAMUSCULAR

## 2018-05-18 MED ORDER — MELOXICAM 7.5 MG PO TABS: 7.5000 mg | ORAL_TABLET | Freq: Every day | ORAL | 0 refills | Status: DC

## 2018-05-18 MED ORDER — DEXAMETHASONE SODIUM PHOSPHATE 10 MG/ML IJ SOLN
INTRAMUSCULAR | Status: AC
  Filled 2018-05-18: qty 1

## 2018-05-18 NOTE — Discharge Instructions (Signed)
Steroid injection today. Start Mobic. Do not take ibuprofen (motrin/advil)/ naproxen (aleve) while on mobic. Ice compress, elevation. Follow up with orthopedics as scheduled for reevaluation if symptoms not improving. If experiencing worsening symptoms with fever, go to the emergency department for further evaluation needed.

## 2018-05-18 NOTE — ED Triage Notes (Signed)
Pt here for inflamed bunion on the left foot. This has been bothering her since yesterday. She took 3 aleve with no relief in the pain.

## 2018-05-18 NOTE — ED Provider Notes (Signed)
[facility]    CSN: [id number] Arrival date & time: 05/18/18  1403     History   Chief Complaint Chief Complaint  Patient presents with  . Foot Pain    HPI Amanda Ponce is a 82 y.o. female.   82 year old female comes in for 3 to 4-day history of left great toe pain.  She has a bunion, states she recently tried on new bunion shoes and wonders if that could have caused the pain.  She has swelling, erythema to the left first MTP joint.  Denies history of gout.  Denies fever, chills, night sweats.  Denies injury/trauma.  Took Aleve without relief.     Past Medical History:  Diagnosis Date  . Arthritis    OA - L hip  . Depression    situational- concerns about her weight and health  . Edema of both legs    ankles swell- pt attributes edema to her weight  . GERD (gastroesophageal reflux disease)   . Headache(784.0)    Sinsus  . Heart murmur   . History of blood transfusion    as a child, transfusion post tonsillectomy- also states that she has areas inher nose that has bleeding tendencies   . History of hiatal hernia   . Hypertension   . Seasonal allergies   . 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      Patient Active Problem List   Diagnosis Date Noted  . Osteoarthritis of right hip 07/11/2016  . Status post total replacement of right hip 07/11/2016  . Osteoarthritis of left hip 08/08/2015  . Status post total replacement of left hip 08/08/2015    Past Surgical History:  Procedure Laterality Date  . ABDOMINAL HYSTERECTOMY  2006  . APPENDECTOMY  1966  . BACK SURGERY    . CESAREAN SECTION     x 2  . Colonscopy    . EYE SURGERY     bil eyes  . Goiter    . Goiter  2008   Benign  . LUMBAR LAMINECTOMY/DECOMPRESSION MICRODISCECTOMY  08/19/2012   Procedure: LUMBAR LAMINECTOMY/DECOMPRESSION MICRODISCECTOMY 3 LEVELS;  Surgeon: Hewitt Shorts, MD;  Location: [facility];  Service: Neurosurgery;  Laterality:  N/A;  Lumbar two-three, Lumbar three-four,Lumbar four-five laminectomies  . TONSILLECTOMY  1948  . TOTAL HIP ARTHROPLASTY Left 08/08/2015   Procedure: LEFT TOTAL HIP ARTHROPLASTY ANTERIOR APPROACH;  Surgeon: Kathryne Hitch, MD;  Location: [facility];  Service: Orthopedics;  Laterality: Left;  . TOTAL HIP ARTHROPLASTY Right 07/11/2016   Procedure: RIGHT TOTAL HIP ARTHROPLASTY ANTERIOR APPROACH;  Surgeon: Kathryne Hitch, MD;  Location: [facility];  Service: Orthopedics;  Laterality: Right;    OB History   None      Home Medications    Prior to Admission medications   Medication Sig Start Date End Date Taking? Authorizing Provider  Amino Acids-Protein Hydrolys (FEEDING SUPPLEMENT, PRO-STAT SUGAR FREE 64,) LIQD Take 30 mLs by mouth 2 (two) times daily with a meal.    [provider]  aspirin EC 325 MG EC tablet Take 1 tablet (325 mg total) by mouth daily. Patient not taking: Reported on 03/20/2017 07/13/16   Kathryne Hitch, MD  bisacodyl (DULCOLAX) 10 MG suppository Place 10 mg rectally daily as needed for moderate constipation.    [provider]  diphenhydrAMINE (BENADRYL) 25 mg capsule Take 25 mg by mouth at bedtime.    [provider]  ferrous sulfate 325 (65 FE) MG tablet Take 325 mg by mouth daily with breakfast.    [provider]  FLUoxetine (PROZAC) 20 MG capsule Take 1 capsule by mouth daily.  05/08/16   [provider]  furosemide (LASIX) 20 MG tablet Take 1 tablet by mouth daily.  06/21/16   [provider]  hydrochlorothiazide (HYDRODIURIL) 25 MG tablet Take 25 mg by mouth daily.     [provider]  meloxicam (MOBIC) 7.5 MG tablet Take 1 tablet (7.5 mg total) by mouth daily. 05/18/18   Cathie HoopsYu, Kingsley Herandez V, PA-C  polyethylene glycol (MIRALAX / GLYCOLAX) packet Take 17 g by mouth 2 (two) times daily.    [provider]  potassium chloride (K-DUR) 10 MEQ tablet  01/21/17   [provider]  potassium  chloride (KLOR-CON) 20 MEQ packet Take 20 mEq by mouth 2 (two) times daily. 07/20/16   [provider]    Family History History reviewed. No pertinent family history.  Social History Social History   Tobacco Use  . Smoking status: Former Smoker    Packs/day: 1.00    Years: 56.00    Pack years: 56.00    Types: E-cigarettes    Last attempt to quit: 11/25/2014    Years since quitting: 3.4  . Smokeless tobacco: Never Used  . Tobacco comment: pt. states she "VAPES"- but my breathing has improved.   Substance Use Topics  . Alcohol use: No  . Drug use: No     Allergies   Patient has no known allergies.   Review of Systems Review of Systems  Reason unable to perform ROS: See HPI as above.     Physical Exam Triage Vital Signs ED Triage Vitals  Enc Vitals Group     BP 05/18/18 1433 (!) 112/56     Pulse Rate 05/18/18 1433 80     Resp 05/18/18 1433 18     Temp 05/18/18 1433 98.2 F (36.8 C)     Temp src --      SpO2 05/18/18 1433 98 %     Weight --      Height --      Head Circumference --      Peak Flow --      Pain Score 05/18/18 1432 10     Pain Loc --      Pain Edu? --      Excl. in GC? --    No data found.  Updated Vital Signs BP (!) 112/56   Pulse 80   Temp 98.2 F (36.8 C)   Resp 18   SpO2 98%   Physical Exam  Constitutional: She is oriented to person, place, and time. She appears well-developed and well-nourished. No distress.  HENT:  Head: Normocephalic and atraumatic.  Eyes: Pupils are equal, round, and reactive to light. Conjunctivae are normal.  Musculoskeletal:  Swelling, erythema with mild increased warmth of the left 1st MTP joint. Tenderness to palpation along the 1st MTP. Full ROM of motion. Sensation intact. Pedal pulse 2+ and equal bilaterally.   Neurological: She is alert and oriented to person, place, and time.     UC Treatments / Results  Labs (all labs ordered are listed, but only abnormal results are displayed) Labs  Reviewed - No data to display  EKG None  Radiology No results found.  Procedures Procedures (including critical care time)  Medications Ordered in UC Medications  dexamethasone (DECADRON) injection 10 mg (has no administration in time range)  Initial Impression / Assessment and Plan / UC Course  I have reviewed the triage vital signs and the nursing notes.  Pertinent labs & imaging results that were available during my care of the patient were reviewed by me and considered in my medical decision making (see chart for details).    Patient requesting joint injection to the left great toe.  Discussed with patient, we do not do joint injections.  We will do Decadron injection in office.  NSAIDs as directed.  Ice compress, elevation.  Patient has orthopedic appointment in 1 week, to follow-up as scheduled for reevaluation if symptoms not improving.  Return precautions given.  Final Clinical Impressions(s) / UC Diagnoses   Final diagnoses:  Great toe pain, left    ED Prescriptions    Medication Sig Dispense Auth. Provider   meloxicam (MOBIC) 7.5 MG tablet Take 1 tablet (7.5 mg total) by mouth daily. 15 tablet Threasa Alpha, [place] 05/18/18 1534

## 2018-05-25 ENCOUNTER — Ambulatory Visit (INDEPENDENT_AMBULATORY_CARE_PROVIDER_SITE_OTHER): Payer: Medicare HMO | Admitting: Physician Assistant

## 2018-11-17 DIAGNOSIS — F419 Anxiety disorder, unspecified: Secondary | ICD-10-CM | POA: Insufficient documentation

## 2018-12-22 ENCOUNTER — Other Ambulatory Visit: Payer: Self-pay | Admitting: Cardiology

## 2018-12-22 DIAGNOSIS — I350 Nonrheumatic aortic (valve) stenosis: Secondary | ICD-10-CM

## 2019-01-20 ENCOUNTER — Ambulatory Visit: Payer: Medicare HMO

## 2019-01-20 DIAGNOSIS — I350 Nonrheumatic aortic (valve) stenosis: Secondary | ICD-10-CM

## 2019-02-02 ENCOUNTER — Encounter: Payer: Self-pay | Admitting: Cardiology

## 2019-02-02 ENCOUNTER — Ambulatory Visit: Payer: Medicare HMO | Admitting: Cardiology

## 2019-02-02 VITALS — BP 130/81 | HR 83 | Ht 64.0 in | Wt 207.0 lb

## 2019-02-02 DIAGNOSIS — I10 Essential (primary) hypertension: Secondary | ICD-10-CM

## 2019-02-02 DIAGNOSIS — I728 Aneurysm of other specified arteries: Secondary | ICD-10-CM

## 2019-02-02 DIAGNOSIS — E782 Mixed hyperlipidemia: Secondary | ICD-10-CM

## 2019-02-02 DIAGNOSIS — I350 Nonrheumatic aortic (valve) stenosis: Secondary | ICD-10-CM | POA: Diagnosis not present

## 2019-02-02 DIAGNOSIS — Q248 Other specified congenital malformations of heart: Secondary | ICD-10-CM

## 2019-02-02 DIAGNOSIS — I2510 Atherosclerotic heart disease of native coronary artery without angina pectoris: Secondary | ICD-10-CM

## 2019-02-02 MED ORDER — LISINOPRIL-HYDROCHLOROTHIAZIDE 20-12.5 MG PO TABS: 1.0000 | ORAL_TABLET | Freq: Every day | ORAL | 2 refills | Status: DC

## 2019-02-02 MED ORDER — ATORVASTATIN CALCIUM 10 MG PO TABS: 10.0000 mg | ORAL_TABLET | Freq: Every day | ORAL | 3 refills | Status: DC

## 2019-02-02 NOTE — Progress Notes (Signed)
Subjective:  Primary Physician:  Chesley Noon, MD  Patient ID: Amanda Ponce, female    DOB: 1936-09-17, 83 y.o.   MRN: [id number]  Chief Complaint  Patient presents with  . Aortic Stenosis    f/u tests     HPI: Amanda Ponce  is a 83 y.o. female  with mild hypertension, chronic tobacco use and now using Vape for the past 4 months,  splenic artery aneurysm awaiting appointment for vascular surgery, and known aortic valve stenosis.  Patient was seen by me on 12/16/2018 for evaluation of gradual worsening dyspnea and intermittent bilateral leg edema.  No chest pain, no dizziness or syncope.  She underwent routine treadmill exercise stress test and an echocardiogram and presents here for follow-up.  Past Medical History:  Diagnosis Date  . Aortic stenosis   . Arthritis    OA - L hip  . Depression    situational- concerns about her weight and health  . Edema of both legs    ankles swell- pt attributes edema to her weight  . GERD (gastroesophageal reflux disease)   . Headache(784.0)    Sinsus  . Heart murmur   . History of blood transfusion    as a child, transfusion post tonsillectomy- also states that she has areas inher nose that has bleeding tendencies   . History of hiatal hernia   . Hypertension   . Seasonal allergies   . 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      Past Surgical History:  Procedure Laterality Date  . ABDOMINAL HYSTERECTOMY  2006  . APPENDECTOMY  1966  . BACK SURGERY    . CESAREAN SECTION     x 2  . Colonscopy    . EYE SURGERY     bil eyes  . Goiter    . Goiter  2008   Benign  . LUMBAR LAMINECTOMY/DECOMPRESSION MICRODISCECTOMY  08/19/2012   Procedure: LUMBAR LAMINECTOMY/DECOMPRESSION MICRODISCECTOMY 3 LEVELS;  Surgeon: Hosie Spangle, MD;  Location: [facility];  Service: Neurosurgery;  Laterality: N/A;  Lumbar two-three, Lumbar three-four,Lumbar four-five laminectomies  . TONSILLECTOMY  1948  .  TOTAL HIP ARTHROPLASTY Left 08/08/2015   Procedure: LEFT TOTAL HIP ARTHROPLASTY ANTERIOR APPROACH;  Surgeon: Mcarthur Rossetti, MD;  Location: [facility];  Service: Orthopedics;  Laterality: Left;  . TOTAL HIP ARTHROPLASTY Right 07/11/2016   Procedure: RIGHT TOTAL HIP ARTHROPLASTY ANTERIOR APPROACH;  Surgeon: Mcarthur Rossetti, MD;  Location: [facility];  Service: Orthopedics;  Laterality: Right;    Social History   Socioeconomic History  . Marital status: Divorced    Spouse name: Not on file  . Number of children: Not on file  . Years of education: Not on file  . Highest education level: Not on file  Occupational History  . Not on file  Social Needs  . Financial resource strain: Not on file  . Food insecurity:    Worry: Not on file    Inability: Not on file  . Transportation needs:    Medical: Not on file    Non-medical: Not on file  Tobacco Use  . Smoking status: Former Smoker    Packs/day: 1.00    Years: 56.00    Pack years: 56.00    Types: E-cigarettes    Last attempt to quit: 11/25/2014    Years since quitting: 4.1  . Smokeless tobacco: Never Used  . Tobacco comment: pt. states  she "VAPES"- but my breathing has improved.   Substance and Sexual Activity  . Alcohol use: No  . Drug use: No  . Sexual activity: Not on file  Lifestyle  . Physical activity:    Days per week: Not on file    Minutes per session: Not on file  . Stress: Not on file  Relationships  . Social connections:    Talks on phone: Not on file    Gets together: Not on file    Attends religious service: Not on file    Active member of club or organization: Not on file    Attends meetings of clubs or organizations: Not on file    Relationship status: Not on file  . Intimate partner violence:    Fear of current or ex partner: Not on file    Emotionally abused: Not on file    Physically abused: Not on file    Forced sexual activity: Not on file  Other Topics Concern  . Not on file  Social History  Narrative  . Not on file    Current Outpatient Medications on File Prior to Visit  Medication Sig Dispense Refill  . diphenhydrAMINE (BENADRYL) 25 mg capsule Take 25 mg by mouth at bedtime.    . furosemide (LASIX) 20 MG tablet Take 1 tablet by mouth as needed.     . hydrochlorothiazide (HYDRODIURIL) 25 MG tablet Take 25 mg by mouth daily.     . meloxicam (MOBIC) 7.5 MG tablet Take 1 tablet (7.5 mg total) by mouth daily. (Patient taking differently: Take 7.5 mg by mouth as needed. ) 15 tablet 0  . potassium chloride (K-DUR,KLOR-CON) 10 MEQ tablet Take 10 mEq by mouth as needed.     No current facility-administered medications on file prior to visit.      Review of Systems  Constitutional: Negative for malaise/fatigue and weight loss.  Respiratory: Positive for shortness of breath. Negative for cough and hemoptysis.   Cardiovascular: Positive for leg swelling. Negative for chest pain, palpitations and claudication.  Gastrointestinal: Positive for heartburn. Negative for abdominal pain, blood in stool, constipation and vomiting.  Genitourinary: Negative for dysuria.  Musculoskeletal: Positive for joint pain. Negative for myalgias.  Neurological: Negative for dizziness, focal weakness and headaches.  Endo/Heme/Allergies: Does not bruise/bleed easily.  Psychiatric/Behavioral: Negative for depression. The patient is not nervous/anxious.   All other systems reviewed and are negative.      Objective:  Blood pressure 130/81, pulse 83, height _0  (1.626 m), weight 207 lb (93.9 kg), SpO2 94 %. Body mass index is 35.53 kg/m.  Physical Exam  Constitutional: She appears well-developed. No distress.  Moderately obese  HENT:  Head: Atraumatic.  Eyes: Conjunctivae are normal.  Neck: Neck supple. No JVD present. No thyromegaly present.  Cardiovascular: Normal rate, regular rhythm and intact distal pulses. Exam reveals no gallop.  Murmur heard.  Scratchy midsystolic murmur is present with a  grade of 2/6 at the upper right sternal border radiating to the neck. Pulses:      Carotid pulses are on the right side with bruit and on the left side with bruit. Pulmonary/Chest: Effort normal and breath sounds normal.  Abdominal: Soft. Bowel sounds are normal.  obese  Musculoskeletal: Normal range of motion.        General: No edema.  Neurological: She is alert.  Skin: Skin is warm and dry.  Psychiatric: She has a normal mood and affect.    CARDIAC STUDIES:   Echocardiogram 12/25/2018:  Left ventricle cavity is normal in size. Concentric hypertrophy of the left ventricle. Normal global wall motion. Doppler evidence of grade I (impaired) diastolic dysfunction, elevated LAP. Calculated EF 53%. Left atrial cavity is mildly dilated. Mild aortic stenosis. Aortic valve mean gradient of 9 mmHg, Vmax of 2.1 m/s. Calculated aortic valve area by continuity equation is 1.3 cm. Mild (Grade I) mitral regurgitation. Complex appearing, moderate, circumferential pericardial effusion with thickening of the pericardium near RV free wall. Subtle early diastolic collapse seen on M mode. With dilated IVC, tamponade physiology is possible. Recommend clinical correlation. Compared to available outside echocardiogram from 2017, aortic stenosis is unchanged. Pericardial effusion is new.  Treadmill exercise stress test 01/20/2019: Indication: Aorta stenosis The patient exercised on Bruce protocol for  03:05 min. Patient achieved  4.73 METS and reached HR  145 bpm, which is  103 % of maximum age-predicted HR.  Stress test terminated due to dyspnea. Resting EKG demonstrates Normal sinus rhythm. ST Changes: With peak exercise there was no ST-T changes of ischemia. Arrhythmias: Occasional PVC s. BP Response to Exercise: Normal resting BP- appropriate response. Exercise capacity was below average for age. Recommendations: Continue primary/secondary prevention. Consider further evaluation for aortic stenosis if  clinically indicated.  Assessment & Recommendations:   1. Aortic valve stenosis, senile degenerative Stablle  2. Essential hypertension EKG 12/16/2018: Sinus rhythm with first-degree AV block at the rate of 70 bpm, left atrial enlargement, incomplete right bundle branch block.  PVC.  No evidence of ischemia.  3. Splenic artery aneurysm (HCC) Asymptomatic and awaiting Vascular eval  4. Coronary artery calcification seen on CAT scan Negative GXT needs primary prevention. Needs statin and an ACEi and patient refuses.   5. Pericardial cyst Low-dose CT scan of the chest 12/60/2018 comparison 06/09/2017: Cardiovascular: Advanced aortic and branch vessel atherosclerosis. Borderline cardiomegaly with lipomatous hypertrophy of the interatrial septum. Multivessel coronary artery atherosclerosis. No change in size of a simple appearing cystic lesion within the anterior mediastinum or associated with the pericardium. 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 Per consensus criteria, presuming the patient is asymptomatic, this requires no further imaging workup.  6. Mixed hyperlipidemia: 03/18/2018: Total cholesterol 212, triglycerides 191, HDL 39, LDL 135, non-HDL cholesterol 173.  BUN 18, creatinine 0.99, potassium 3.2, eGFR 54 mL.  CBC normal.  TSH normal.  Recommendation:  I reviewed the results of the stress test and echocardiogram with the patient, patient's symptoms are related to deconditioning, obesity and also diastolic dysfunction contributing to her dyspnea.  After long discussion regarding her lipids, underlying coronary artery disease and coronary atherosclerosis noted on the CT scan, she is willing to go on a low-dose high-intensity statin, I'll start her on 10 mg of Lipitor.  I'll also start her on 10 mg of lisinopril for vascular benefits.  I'd like to obtain lipid profile testing in 3 months along with CMP and see her back at that time.  If she remains stable, I can see her back on a  p.r.n. basis.  She had done well with going to [employer] but only daily for 2 months.  I have encouraged her to join [employer] again and start diet changes again.  Increasing physical activity will certainly help. She has conducted carotid bruit, will consider carotid duplex on her next OV.  40 minute encounter discussion regarding diastolic CHF, aortic stenosis, dyspnea.  Discussion regarding her previous CT chest and findings of CAD and secondary prevention and initiation of statin therapy and labs ordered.   Ulice Dash  Einar Gip, MD, [facility] 02/02/2019, 3:34 PM [facility]. [place] Pager: [phone] Office: [phone] If no answer Cell [phone]

## 2019-02-05 ENCOUNTER — Telehealth: Payer: Self-pay

## 2019-02-05 NOTE — Telephone Encounter (Signed)
Pt called regarding Atorvastatin, she cannot tolerate it, muscle aches. She stopped taking it.

## 2019-02-06 NOTE — Telephone Encounter (Signed)
Is she willing to try a small dose of Simvastatin 20 mg after dinner daily

## 2019-02-09 ENCOUNTER — Other Ambulatory Visit: Payer: Self-pay

## 2019-02-09 ENCOUNTER — Other Ambulatory Visit: Payer: Self-pay | Admitting: Cardiology

## 2019-02-09 MED ORDER — SIMVASTATIN 20 MG PO TABS: 20.0000 mg | ORAL_TABLET | Freq: Every day | ORAL | 0 refills | Status: DC

## 2019-02-09 NOTE — Telephone Encounter (Signed)
The pt is willing to try simvastatin 20 mg. I sent this to her pharmacy.

## 2019-02-18 ENCOUNTER — Telehealth: Payer: Self-pay | Admitting: Cardiology

## 2019-02-18 LAB — COMPREHENSIVE METABOLIC PANEL
ALBUMIN: 4.3 g/dL (ref 3.7–4.7)
ALT: 13 IU/L (ref 0–32)
AST: 16 IU/L (ref 0–40)
Albumin/Globulin Ratio: 1.6 (ref 1.2–2.2)
Alkaline Phosphatase: 63 IU/L (ref 39–117)
BUN / CREAT RATIO: 19 (ref 12–28)
BUN: 18 mg/dL (ref 8–27)
Bilirubin Total: 0.4 mg/dL (ref 0.0–1.2)
CO2: 25 mmol/L (ref 20–29)
Calcium: 9.6 mg/dL (ref 8.7–10.3)
Chloride: 102 mmol/L (ref 96–106)
Creatinine, Ser: 0.95 mg/dL (ref 0.57–1.00)
GFR calc Af Amer: 65 mL/min/{1.73_m2} (ref >59)
GFR, EST NON AFRICAN AMERICAN: 57 mL/min/{1.73_m2} — AB (ref >59)
GLOBULIN, TOTAL: 2.7 g/dL (ref 1.5–4.5)
GLUCOSE: 90 mg/dL (ref 65–99)
Potassium: 4.7 mmol/L (ref 3.5–5.2)
SODIUM: 142 mmol/L (ref 134–144)
TOTAL PROTEIN: 7 g/dL (ref 6.0–8.5)

## 2019-02-18 LAB — LIPID PANEL WITH LDL/HDL RATIO
CHOLESTEROL TOTAL: 174 mg/dL (ref 100–199)
HDL: 42 mg/dL (ref >39)
LDL Calculated: 102 mg/dL — ABNORMAL HIGH (ref 0–99)
LDl/HDL Ratio: 2.4 ratio (ref 0.0–3.2)
TRIGLYCERIDES: 152 mg/dL — AB (ref 0–149)
VLDL Cholesterol Cal: 30 mg/dL (ref 5–40)

## 2019-02-18 NOTE — Telephone Encounter (Signed)
Pt aware of results 

## 2019-04-08 ENCOUNTER — Ambulatory Visit: Payer: Medicare HMO | Admitting: Cardiology

## 2019-04-28 ENCOUNTER — Other Ambulatory Visit: Payer: Self-pay | Admitting: Cardiology

## 2019-04-28 DIAGNOSIS — I10 Essential (primary) hypertension: Secondary | ICD-10-CM

## 2019-04-28 NOTE — Telephone Encounter (Signed)
Please fill

## 2019-08-08 ENCOUNTER — Other Ambulatory Visit: Payer: Self-pay | Admitting: Cardiology

## 2019-08-08 DIAGNOSIS — I10 Essential (primary) hypertension: Secondary | ICD-10-CM

## 2019-11-05 ENCOUNTER — Other Ambulatory Visit: Payer: Self-pay

## 2019-11-05 DIAGNOSIS — I10 Essential (primary) hypertension: Secondary | ICD-10-CM

## 2019-11-05 MED ORDER — LISINOPRIL-HYDROCHLOROTHIAZIDE 20-12.5 MG PO TABS: 1.0000 | ORAL_TABLET | Freq: Every day | ORAL | 0 refills | Status: DC

## 2020-01-16 ENCOUNTER — Ambulatory Visit: Payer: Medicare HMO

## 2020-02-15 ENCOUNTER — Other Ambulatory Visit: Payer: Self-pay | Admitting: Cardiology

## 2020-02-15 DIAGNOSIS — I10 Essential (primary) hypertension: Secondary | ICD-10-CM

## 2020-03-27 NOTE — Progress Notes (Signed)
Primary Physician/Referring:  Chesley Noon, MD  Patient ID: Amanda Ponce, female    DOB: 1936-08-08, 84 y.o.   MRN: [id number]  Chief Complaint  Patient presents with  . Follow-up    1 year  . Shortness of Breath  . Hyperlipidemia   HPI:    Amanda Ponce  is a 84 y.o. female with mild hypertension, chronic tobacco use and now using Vape,  splenic artery aneurysm awaiting appointment for vascular surgery, coronary and aortic atherosclerosis by CT scan of the chest, mild aortic stenosis and chronic dyspnea presents here for annual visit.  No significant change in symptoms, due to hyperlipidemia she has been prescribed atorvastatin by her PCP, patient has not started this yet.  No chest pain, no palpitations, no leg edema or PND.  No recent weight changes.   Past Medical History:  Diagnosis Date  . Aortic stenosis   . Arthritis    OA - L hip  . Depression    situational- concerns about her weight and health  . Edema of both legs    ankles swell- pt attributes edema to her weight  . GERD (gastroesophageal reflux disease)   . Headache(784.0)    Sinsus  . Heart murmur   . History of blood transfusion    as a child, transfusion post tonsillectomy- also states that she has areas inher nose that has bleeding tendencies   . History of hiatal hernia   . Hypertension   . Seasonal allergies   . 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     Past Surgical History:  Procedure Laterality Date  . ABDOMINAL HYSTERECTOMY  2006  . APPENDECTOMY  1966  . BACK SURGERY    . CESAREAN SECTION     x 2  . Colonscopy    . EYE SURGERY     bil eyes  . Goiter    . Goiter  2008   Benign  . LUMBAR LAMINECTOMY/DECOMPRESSION MICRODISCECTOMY  08/19/2012   Procedure: LUMBAR LAMINECTOMY/DECOMPRESSION MICRODISCECTOMY 3 LEVELS;  Surgeon: Hosie Spangle, MD;  Location: [place];  Service: Neurosurgery;  Laterality: N/A;  Lumbar two-three, Lumbar  three-four,Lumbar four-five laminectomies  . TONSILLECTOMY  1948  . TOTAL HIP ARTHROPLASTY Left 08/08/2015   Procedure: LEFT TOTAL HIP ARTHROPLASTY ANTERIOR APPROACH;  Surgeon: Mcarthur Rossetti, MD;  Location: [facility];  Service: Orthopedics;  Laterality: Left;  . TOTAL HIP ARTHROPLASTY Right 07/11/2016   Procedure: RIGHT TOTAL HIP ARTHROPLASTY ANTERIOR APPROACH;  Surgeon: Mcarthur Rossetti, MD;  Location: [facility];  Service: Orthopedics;  Laterality: Right;   Family History  Problem Relation Age of Onset  . Hypertension Mother   . Stroke Father   . Hyperlipidemia Father     Social History   Tobacco Use  . Smoking status: Former Smoker    Packs/day: 1.00    Years: 56.00    Pack years: 56.00    Types: E-cigarettes    Quit date: 11/25/2014    Years since quitting: 5.3  . Smokeless tobacco: Never Used  . Tobacco comment: pt. states she "VAPES"- but my breathing has improved.   Substance Use Topics  . Alcohol use: No   Marital Status: Divorced  ROS  Review of Systems  Constitution: Negative for chills, decreased appetite, malaise/fatigue and weight gain.  Cardiovascular: Positive for leg swelling. Negative for chest pain, dyspnea on exertion, palpitations and syncope.  Respiratory: Positive for shortness of  breath. Negative for cough and hemoptysis.   Endocrine: Negative for cold intolerance.  Hematologic/Lymphatic: Does not bruise/bleed easily.  Musculoskeletal: Positive for joint pain. Negative for joint swelling and myalgias.  Gastrointestinal: Positive for heartburn. Negative for abdominal pain, anorexia, change in bowel habit, hematochezia, melena and vomiting.  Neurological: Negative for headaches and light-headedness.  Psychiatric/Behavioral: Negative for depression and substance abuse.   Objective  Blood pressure 130/74, pulse 81, temperature 98.1 F (36.7 C), temperature source Temporal, resp. rate 15, height 5' 4" (1.626 m), weight 209 lb 12.8 oz (95.2 kg), SpO2 96  %.  Vitals with BMI 03/30/2020 02/02/2019 05/18/2018  Height 5' 4" 5' 4" -  Weight 209 lbs 13 oz 207 lbs -  BMI 23.36 12.24 -  Systolic 497 530 051  Diastolic 74 81 56  Pulse 81 83 80     Physical Exam  Constitutional:  Morderately Obese  HENT:  Head: Atraumatic.  Neck: No JVD present.  Cardiovascular: Normal rate, regular rhythm and intact distal pulses. Exam reveals no gallop.  Murmur heard.  Midsystolic murmur is present with a grade of 2/6 at the upper right sternal border radiating to the neck. Pulses:      Carotid pulses are on the right side with bruit and on the left side with bruit. No JVD, Trace bilateral leg edema.  Pulmonary/Chest: Effort normal and breath sounds normal. No accessory muscle usage. No respiratory distress.  Abdominal: Soft. Bowel sounds are normal.  obese  Psychiatric: She has a normal mood and affect.   Laboratory examination:   No results for input(s): NA, K, CL, CO2, GLUCOSE, BUN, CREATININE, CALCIUM, GFRNONAA, GFRAA in the last 8760 hours. CrCl cannot be calculated (Patient's most recent lab result is older than the maximum 21 days allowed.).  CMP Latest Ref Rng & Units 02/17/2019 07/22/2016 07/16/2016  Glucose 65 - 99 mg/dL 90 - -  BUN 8 - 27 mg/dL 18 18 26(A)  Creatinine 0.57 - 1.00 mg/dL 0.95 0.9 0.9  Sodium 134 - 144 mmol/L 142 142 141  Potassium 3.5 - 5.2 mmol/L 4.7 4.1 2.9(A)  Chloride 96 - 106 mmol/L 102 - -  CO2 20 - 29 mmol/L 25 - -  Calcium 8.7 - 10.3 mg/dL 9.6 - -  Total Protein 6.0 - 8.5 g/dL 7.0 - -  Total Bilirubin 0.0 - 1.2 mg/dL 0.4 - -  Alkaline Phos 39 - 117 IU/L 63 - 45  AST 0 - 40 IU/L 16 - 17  ALT 0 - 32 IU/L 13 - 13   CBC Latest Ref Rng & Units 07/22/2016 07/16/2016 07/13/2016  WBC 10:3/mL 7.0 7.4 12.4(H)  Hemoglobin 12.0 - 16.0 g/dL 9.4(A) 8.7(A) 9.4(L)  Hematocrit 36 - 46 % 29(A) 26(A) 27.7(L)  Platelets 150 - 399 K/L 285 263 239   Lipid Panel     Component Value Date/Time   CHOL 174 02/17/2019 0937   TRIG 152 (H)  02/17/2019 0937   HDL 42 02/17/2019 0937   LDLCALC 102 (H) 02/17/2019 0937   HEMOGLOBIN A1C No results found for: HGBA1C, MPG TSH No results for input(s): TSH in the last 8760 hours.  External labs:   03/27/2020:   Glucose 99, BUN 22, eGFR 49, BUN/Creatinine 21, Na 138,  K 5.1, Total Protein 7.1, Albumin, Serum 4.5, AST 16, ALT 11. WBC 10.7, RBC 4.39, H/H 13.6/40.9, MCV 93, Platelets 288.   Lipid Panel: Cholesterol 197, Triglycerides 219, HDL 36, VLDL 39, LDL 122. NT-proBNP 94. TSH normal.   03/18/2018: Total  cholesterol 212, triglycerides 191, HDL 39, LDL 135, non-HDL cholesterol 173.  BUN 18, creatinine 0.99, potassium 3.2, eGFR 54 mL.  CBC normal.  TSH normal.  Medications and allergies  No Known Allergies   Current Outpatient Medications  Medication Instructions  . aspirin EC 81 mg, Oral, Daily  . diphenhydrAMINE (BENADRYL) 25 mg, Oral, Daily at bedtime  . lisinopril-hydrochlorothiazide (ZESTORETIC) 20-12.5 MG tablet 1 tablet, Oral, Daily  . meloxicam (MOBIC) 7.5 mg, Oral, Daily  . Probiotic Product (PRO-BIOTIC BLEND PO) Oral   Radiology:   Low-dose CT scan of the chest 12/60/2018 comparison 06/09/2017: Cardiovascular: Advanced aortic and branch vessel atherosclerosis. Borderline cardiomegaly with lipomatous hypertrophy of the interatrial septum. Multivessel coronary artery atherosclerosis. No change in size of a simple appearing cystic lesion within the anterior mediastinum or associated with the pericardium. 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 Per consensus criteria, presuming the patient is asymptomatic, this requires no further imaging workup.  Cardiac Studies:   Echocardiogram 12/25/2018: Left ventricle cavity is normal in size. Concentric hypertrophy of the left ventricle. Normal global wall motion. Doppler evidence of grade I (impaired) diastolic dysfunction, elevated LAP. Calculated EF 53%. Left atrial cavity is mildly dilated. Mild aortic stenosis. Aortic  valve mean gradient of 9 mmHg, Vmax of 2.1 m/s. Calculated aortic valve area by continuity equation is 1.3 cm. Mild (Grade I) mitral regurgitation. Complex appearing, moderate, circumferential pericardial effusion with thickening of the pericardium near RV free wall. Subtle early diastolic collapse seen on M mode. With dilated IVC, tamponade physiology is possible. Recommend clinical correlation. Compared to available outside echocardiogram from 2017, aortic stenosis is unchanged. Pericardial effusion is new.  Treadmill exercise stress test 01/20/2019: Indication: Aorta stenosis The patient exercised on Bruce protocol for  03:05 min. Patient achieved  4.73 METS and reached HR  145 bpm, which is  103 % of maximum age-predicted HR.  Stress test terminated due to dyspnea. Resting EKG demonstrates Normal sinus rhythm. ST Changes: With peak exercise there was no ST-T changes of ischemia. Arrhythmias: Occasional PVC s. BP Response to Exercise: Normal resting BP- appropriate response. Exercise capacity was below average for age. Recommendations: Continue primary/secondary prevention. Consider further evaluation for aortic stenosis if clinically indicated.  EKG  EKG 03/30/2020: Normal sinus rhythm at rate of 78 bpm, left atrial enlargement, normal axis.  Incomplete right bundle branch block.  Frequent PVCs (4).  No significant change from 12/16/2018.   Assessment     ICD-10-CM   1. Nonrheumatic aortic valve stenosis  I35.0 EKG 12-Lead  2. Essential hypertension  I10   3. Coronary artery calcification seen on CAT scan  I25.10 aspirin EC 81 MG tablet  4. Mixed hyperlipidemia  E78.2     Meds ordered this encounter  Medications  . aspirin EC 81 MG tablet    Sig: Take 1 tablet (81 mg total) by mouth daily.    Dispense:  90 tablet    Refill:  3    Medications Discontinued During This Encounter  Medication Reason  . furosemide (LASIX) 20 MG tablet Patient Preference  . potassium chloride  (K-DUR,KLOR-CON) 10 MEQ tablet No longer needed (for PRN medications)  . simvastatin (ZOCOR) 20 MG tablet Patient Preference    Recommendations:   THU BAGGETT  is a 84 y.o.  female with mild hypertension, chronic tobacco use and now using Vape,  splenic artery aneurysm awaiting appointment for vascular surgery, coronary and aortic atherosclerosis by CT scan of the chest, mild aortic stenosis and chronic dyspnea presents  here for annual visit.  Patient with chronic dyspnea, related to underlying obesity and hypoventilation and also diabetes disorder, mixed hyperlipidemia, has been hesitant to starting statins but she is now willing to start atorvastatin at 10 mg which has been prescribed by her PCP.  With regard to her dyspnea, she has had a negative treadmill stress test in February 2020 and echocardiogram revealing very mild aortic stenosis.  No change in her physical exam, no clinical evidence of heart failure.  I encouraged her to completely remain abstinent from all nicotine products, weight loss was also discussed, lipids reviewed and discussed.  Stable from cardiac standpoint, unless she has chest pain or worsening dyspnea, I will see her back in a year.  Adrian Prows, MD, [facility] 03/30/2020, 10:03 AM [facility] [place] Office: [phone]

## 2020-03-30 ENCOUNTER — Encounter: Payer: Self-pay | Admitting: Cardiology

## 2020-03-30 ENCOUNTER — Other Ambulatory Visit: Payer: Self-pay

## 2020-03-30 ENCOUNTER — Ambulatory Visit: Payer: Medicare HMO | Admitting: Cardiology

## 2020-03-30 VITALS — BP 130/74 | HR 81 | Temp 98.1°F | Resp 15 | Ht 64.0 in | Wt 209.8 lb

## 2020-03-30 DIAGNOSIS — I350 Nonrheumatic aortic (valve) stenosis: Secondary | ICD-10-CM

## 2020-03-30 DIAGNOSIS — I2510 Atherosclerotic heart disease of native coronary artery without angina pectoris: Secondary | ICD-10-CM

## 2020-03-30 DIAGNOSIS — E782 Mixed hyperlipidemia: Secondary | ICD-10-CM

## 2020-03-30 DIAGNOSIS — I10 Essential (primary) hypertension: Secondary | ICD-10-CM

## 2020-03-30 MED ORDER — ASPIRIN EC 81 MG PO TBEC: 81.0000 mg | DELAYED_RELEASE_TABLET | Freq: Every day | ORAL | 3 refills | Status: DC

## 2020-04-26 ENCOUNTER — Encounter: Payer: Self-pay | Admitting: Orthopaedic Surgery

## 2020-04-26 ENCOUNTER — Other Ambulatory Visit: Payer: Self-pay

## 2020-04-26 ENCOUNTER — Ambulatory Visit (INDEPENDENT_AMBULATORY_CARE_PROVIDER_SITE_OTHER): Payer: Medicare HMO

## 2020-04-26 ENCOUNTER — Ambulatory Visit: Payer: Medicare HMO | Admitting: Orthopaedic Surgery

## 2020-04-26 DIAGNOSIS — R600 Localized edema: Secondary | ICD-10-CM | POA: Insufficient documentation

## 2020-04-26 DIAGNOSIS — Z96643 Presence of artificial hip joint, bilateral: Secondary | ICD-10-CM

## 2020-04-26 DIAGNOSIS — M5136 Other intervertebral disc degeneration, lumbar region: Secondary | ICD-10-CM

## 2020-04-26 MED ORDER — METHOCARBAMOL 500 MG PO TABS: 500.0000 mg | ORAL_TABLET | Freq: Three times a day (TID) | ORAL | 1 refills | Status: DC

## 2020-04-26 MED ORDER — METHOCARBAMOL 500 MG PO TABS: 500.0000 mg | ORAL_TABLET | Freq: Every day | ORAL | 1 refills | Status: DC

## 2020-04-26 MED ORDER — METHYLPREDNISOLONE 4 MG PO TABS: ORAL_TABLET | ORAL | 0 refills | Status: DC

## 2020-04-26 NOTE — Progress Notes (Signed)
Office Visit Note   Patient: Amanda Ponce           Date of Birth: [birth year]           MRN: [id number] Visit Date: 04/26/2020              Requested by: [name], MD [address] [place],  [place] [address] PCP: [name], MD   Assessment & Plan: Visit Diagnoses:  1. H/O bilateral hip replacements   2. Degenerative disc disease, lumbar     Plan: We will place her on Medrol Dosepak and Robaxin 1 tablet at night.  She has tolerated Robaxin in the past.  She is unable to take NSAIDs due to the fact that she has been told her creatinine was elevated at last lab work and she is willing to take Tylenol.  Also send her to formal physical therapy for back exercises, core strengthening, stretching particularly hamstrings modalities and home exercise program.  She will follow-up with [place] in a month to see what type of response she had to this treatment.  Questions were encouraged and answered by Dr. Magnus Ivan and myself.  Follow-Up Instructions: Return in about 4 weeks (around 05/24/2020).   Orders:  Orders Placed This Encounter  Procedures  . XR HIPS BILAT W OR W/O PELVIS 2V  . XR Lumbar Spine 2-3 Views   Meds ordered this encounter  Medications  . methylPREDNISolone (MEDROL) 4 MG tablet    Sig: Take as directed    Dispense:  21 tablet    Refill:  0  . DISCONTD: methocarbamol (ROBAXIN) 500 MG tablet    Sig: Take 1 tablet (500 mg total) by mouth 3 (three) times daily.    Dispense:  40 tablet    Refill:  1  . methocarbamol (ROBAXIN) 500 MG tablet    Sig: Take 1 tablet (500 mg total) by mouth at bedtime.    Dispense:  40 tablet    Refill:  1      Procedures: No procedures performed   Clinical Data: No additional findings.   Subjective: Chief Complaint  Patient presents with  . Left Hip - Follow-up  . Right Hip - Follow-up    HPI Amanda Ponce is well-known to Dr. Magnus Ivan service comes in today due to low back pain leg pain.  She states she cannot  walk for a long time due to the pain in her low back and her legs particularly the right hip region.  She is having no groin pain.  History of right total hip arthroplasty 07/11/2016 and left total hip arthroplasty 08/08/2015.  She denies any radicular symptoms down either leg.  She denies any calf pain.  Denies any saddle anesthesia.  She has chronic urinary incontinence but otherwise no bowel bladder dysfunction.  Low back pain does awaken her at night.  She denies any fevers, chills, or chest pain.  No injury to either hip or back.  She does report that she went back to [employer] after being absent from there for over a year and that she she had significant pain in her back and was not able do the exercises she had done in the past.  Her low back pain has been ongoing for the past year but due to the pandemic she just has not seen anyone for it.  History of lumbar laminectomy/decompression microdiscectomy by Dr. Newell Coral in 2013.  Review of Systems Please see HPI  Objective: Vital Signs: There were  no vitals taken for this visit.  Physical Exam Constitutional:      Appearance: She is not ill-appearing or diaphoretic.  Cardiovascular:     Pulses: Normal pulses.  Pulmonary:     Effort: Pulmonary effort is normal.  Neurological:     Mental Status: She is alert and oriented to person, place, and time.  Psychiatric:        Mood and Affect: Mood normal.     Ortho Exam Lumbar spine she has tenderness over the lower lumbar spine and over the right paraspinous region.  Negative straight leg raise bilaterally.  Quisling tight hamstrings bilaterally.  She has forward flexion coming within 6 inches of being on touch her toes.  Limited extension of the lumbar spine with discomfort.  Lower extremity strength testing 5 out of 5 throughout except her great toes with aches extension against resistance 4 out of 5 bilaterally.  Deep tendon reflexes are 2+ at the knees and ankles and equal and symmetric.   Sensation grossly intact bilateral feet to light touch.  Good range of motion of both hips without pain.  Specialty Comments:  No specialty comments available.  Imaging: XR HIPS BILAT W OR W/O PELVIS 2V  Result Date: 04/26/2020 AP pelvis lateral views bilateral hips: Status post bilateral total hip arthroplasties with well-seated components.  Both hips well located.  No acute fractures no acute findings.  XR Lumbar Spine 2-3 Views  Result Date: 04/26/2020 Lumbar spine: Degenerative changes throughout with loss of disc space.  Facet changes throughout lumbar spine most notably at L4-5 L5-S1.  Vertebral body anterior spurring at multiple levels.  Arthrosclerosis of the aorta noted.  No acute fractures.  No spondylolisthesis.    PMFS History: Patient Active Problem List   Diagnosis Date Noted  . Aortic stenosis   . Osteoarthritis of right hip 07/11/2016  . Status post total replacement of right hip 07/11/2016  . Osteoarthritis of left hip 08/08/2015  . Status post total replacement of left hip 08/08/2015   Past Medical History:  Diagnosis Date  . Aortic stenosis   . Arthritis    OA - L hip  . Depression    situational- concerns about her weight and health  . Edema of both legs    ankles swell- pt attributes edema to her weight  . GERD (gastroesophageal reflux disease)   . Headache(784.0)    Sinsus  . Heart murmur   . History of blood transfusion    as a child, transfusion post tonsillectomy- also states that she has areas inher nose that has bleeding tendencies   . History of hiatal hernia   . Hypertension   . Seasonal allergies   . 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      Family History  Problem Relation Age of Onset  . Hypertension Mother   . Stroke Father   . Hyperlipidemia Father     Past Surgical History:  Procedure Laterality Date  . ABDOMINAL HYSTERECTOMY  2006  . APPENDECTOMY  1966  . BACK SURGERY    . CESAREAN  SECTION     x 2  . Colonscopy    . EYE SURGERY     bil eyes  . Goiter    . Goiter  2008   Benign  . LUMBAR LAMINECTOMY/DECOMPRESSION MICRODISCECTOMY  08/19/2012   Procedure: LUMBAR LAMINECTOMY/DECOMPRESSION MICRODISCECTOMY 3 LEVELS;  Surgeon: Hosie Spangle, MD;  Location: [facility];  Service: Neurosurgery;  Laterality: N/A;  Lumbar two-three, Lumbar three-four,Lumbar four-five laminectomies  . TONSILLECTOMY  1948  . TOTAL HIP ARTHROPLASTY Left 08/08/2015   Procedure: LEFT TOTAL HIP ARTHROPLASTY ANTERIOR APPROACH;  Surgeon: Kathryne Hitch, MD;  Location: [facility];  Service: Orthopedics;  Laterality: Left;  . TOTAL HIP ARTHROPLASTY Right 07/11/2016   Procedure: RIGHT TOTAL HIP ARTHROPLASTY ANTERIOR APPROACH;  Surgeon: Kathryne Hitch, MD;  Location: [facility];  Service: Orthopedics;  Laterality: Right;   Social History   Occupational History  . Not on file  Tobacco Use  . Smoking status: Former Smoker    Packs/day: 1.00    Years: 56.00    Pack years: 56.00    Types: E-cigarettes    Quit date: 11/25/2014    Years since quitting: 5.4  . Smokeless tobacco: Never Used  . Tobacco comment: pt. states she "VAPES"- but my breathing has improved.   Substance and Sexual Activity  . Alcohol use: No  . Drug use: No  . Sexual activity: Not Currently

## 2020-04-27 ENCOUNTER — Ambulatory Visit: Payer: Medicare HMO | Admitting: Orthopaedic Surgery

## 2020-05-24 ENCOUNTER — Ambulatory Visit: Payer: Medicare HMO | Admitting: Orthopaedic Surgery

## 2020-05-31 ENCOUNTER — Ambulatory Visit: Payer: Medicare HMO | Admitting: Orthopaedic Surgery

## 2020-06-14 ENCOUNTER — Encounter: Payer: Self-pay | Admitting: Orthopaedic Surgery

## 2020-06-14 ENCOUNTER — Ambulatory Visit: Payer: Medicare HMO | Admitting: Orthopaedic Surgery

## 2020-06-14 ENCOUNTER — Other Ambulatory Visit: Payer: Self-pay

## 2020-06-14 DIAGNOSIS — M5136 Other intervertebral disc degeneration, lumbar region: Secondary | ICD-10-CM | POA: Diagnosis not present

## 2020-06-14 DIAGNOSIS — G8929 Other chronic pain: Secondary | ICD-10-CM | POA: Diagnosis not present

## 2020-06-14 DIAGNOSIS — M545 Low back pain, unspecified: Secondary | ICD-10-CM

## 2020-06-14 NOTE — Progress Notes (Signed)
The patient comes in with continued low back pain.  She has a history of a microdiscectomy at 3 levels by the neurosurgeons in town specifically Dr. Newell Coral back in 2013.  She has been through physical therapy and tried anti-inflammatories.  Her back is still hurting just in the back itself and there is no radicular component.  I have replaced both her hips.  At her last visit we sent her through physical therapy and she says this is not helping.  She does take Robaxin at night and it does help her get about 5 hours or rest.  On exam she still hurts just in the lower lumbar spine mainly to the right side.  There is no radicular component.  This point I would like to send her for an MRI of her lumbar spine given her multiple microdiscectomies and her continued pain so we can see if this is facet joint related or scar tissue and whether or not she would benefit from an intervention in her spine such as a facet joint injection.  With the failed conservative treatment I think this is warranted as does she.  We will see her back after the MRI.  All questions and concerns were answered and addressed.

## 2020-06-15 ENCOUNTER — Other Ambulatory Visit: Payer: Self-pay

## 2020-06-15 ENCOUNTER — Encounter: Payer: Self-pay | Admitting: Orthopaedic Surgery

## 2020-06-15 DIAGNOSIS — M4807 Spinal stenosis, lumbosacral region: Secondary | ICD-10-CM

## 2020-06-28 ENCOUNTER — Ambulatory Visit: Payer: Medicare HMO | Admitting: Orthopaedic Surgery

## 2020-07-07 ENCOUNTER — Other Ambulatory Visit: Payer: Self-pay | Admitting: Physician Assistant

## 2020-07-15 ENCOUNTER — Ambulatory Visit
Admission: RE | Admit: 2020-07-15 | Discharge: 2020-07-15 | Disposition: A | Discharge status: Home / Self Care | Payer: Medicare HMO | Source: Ambulatory Visit | Attending: Orthopaedic Surgery | Admitting: Orthopaedic Surgery

## 2020-07-15 DIAGNOSIS — M4807 Spinal stenosis, lumbosacral region: Secondary | ICD-10-CM

## 2020-07-19 ENCOUNTER — Ambulatory Visit: Payer: Medicare HMO | Admitting: Orthopaedic Surgery

## 2020-07-27 ENCOUNTER — Ambulatory Visit: Payer: Medicare HMO | Admitting: Orthopaedic Surgery

## 2020-07-27 ENCOUNTER — Encounter: Payer: Self-pay | Admitting: Orthopaedic Surgery

## 2020-07-27 DIAGNOSIS — M4807 Spinal stenosis, lumbosacral region: Secondary | ICD-10-CM

## 2020-07-27 DIAGNOSIS — M5136 Other intervertebral disc degeneration, lumbar region: Secondary | ICD-10-CM

## 2020-07-27 NOTE — Progress Notes (Signed)
The patient comes in today to go over an MRI of her lumbar spine.  She has remote history of laminectomies at L3-L4 and L4-L5 done by Dr. Newell Coral.  She has been having right-sided low back pain with no radicular component.  Is been in the mid to lower lumbar spine but not at the lowest aspect.  She still denies any radicular symptoms.  She is trying a patch back there as well.  On exam her pain is consistent around the posterior spinal elements and just to the right side at the mid to lower lumbar spine.  She has a negative straight leg raise bilaterally and good strength in both of her legs.  The MRI does show actually improvement from comparison to 2012.  There is no significant neural impingement in her back but there is arthritis at several levels involving mainly the facet joints.  She would like to try seeing Dr. Alvester Morin for old right-sided L3-L4 or L4-L5 facet joint injections I think this would be reasonable.  I have recommended also Voltaren gel to try.  We will work on getting appointment set up with Dr. Alvester Morin.  All questions and concerns were answered and addressed.

## 2020-07-28 ENCOUNTER — Other Ambulatory Visit: Payer: Self-pay

## 2020-07-28 DIAGNOSIS — M5136 Other intervertebral disc degeneration, lumbar region: Secondary | ICD-10-CM

## 2020-07-28 DIAGNOSIS — M4807 Spinal stenosis, lumbosacral region: Secondary | ICD-10-CM

## 2020-08-16 ENCOUNTER — Ambulatory Visit: Payer: Self-pay

## 2020-08-16 ENCOUNTER — Encounter: Payer: Self-pay | Admitting: Physical Medicine and Rehabilitation

## 2020-08-16 ENCOUNTER — Ambulatory Visit (INDEPENDENT_AMBULATORY_CARE_PROVIDER_SITE_OTHER): Payer: Medicare HMO | Admitting: Physical Medicine and Rehabilitation

## 2020-08-16 ENCOUNTER — Other Ambulatory Visit: Payer: Self-pay

## 2020-08-16 VITALS — BP 114/56 | HR 89

## 2020-08-16 DIAGNOSIS — M47816 Spondylosis without myelopathy or radiculopathy, lumbar region: Secondary | ICD-10-CM

## 2020-08-16 MED ORDER — BUPIVACAINE HCL 0.5 % IJ SOLN
3.0000 mL | Freq: Once | INTRAMUSCULAR | Status: AC
  Administered 2020-08-16: 3 mL

## 2020-08-16 MED ORDER — METHYLPREDNISOLONE ACETATE 80 MG/ML IJ SUSP
80.0000 mg | Freq: Once | INTRAMUSCULAR | Status: AC
  Administered 2020-08-16: 80 mg

## 2020-08-16 NOTE — Progress Notes (Signed)
Pt state lower back pain. Pt state walking makes the pain worse.Pt state sitting helps a ease the pain.  Numeric Pain Rating Scale and Functional Assessment Average Pain 4   In the last MONTH (on 0-10 scale) has pain interfered with the following?  1. General activity like being  able to carry out your everyday physical activities such as walking, climbing stairs, carrying groceries, or moving a chair?  Rating(8)   +Driver, -BT, -Dye Allergies.

## 2020-08-17 ENCOUNTER — Telehealth: Payer: Self-pay | Admitting: Physical Medicine and Rehabilitation

## 2020-08-17 NOTE — Telephone Encounter (Signed)
Needs auth for repeat right L3-4 and L4-5 facet/ MBB. Patient has been scheduled.

## 2020-08-17 NOTE — Telephone Encounter (Signed)
Pt has been approve for [id number], Auth# [id number]

## 2020-08-18 ENCOUNTER — Encounter: Payer: Self-pay | Admitting: Physical Medicine and Rehabilitation

## 2020-08-20 NOTE — Progress Notes (Signed)
Name: Amanda Ponce  MRN/ DOB: [id number], September 02, 1936    Age/ Sex: 84 y.o., female    PCP: [name], MD   Reason for Endocrinology Evaluation: MNG     Date of Initial Endocrinology Evaluation: 08/20/2020     HPI: Ms. Amanda Ponce is a 84 y.o. female with a past medical history of HTN and dyslipidemia . The patient presented for initial endocrinology clinic visit on 08/20/2020 for consultative assistance with her MNG   She has been diagnosed with multinodular goiter since 2006 based on thyroid ultrasound showing bilateral thyroid nodules.   She is S/P left lobectomy in 2007 due to follicular lesion with benign pathology.    Today she denies any local neck swelling.   She has been gaining weight  Has back pain Has occasional constipation       HISTORY:  Past Medical History:  Past Medical History:  Diagnosis Date  . Aortic stenosis   . Arthritis    OA - L hip  . Depression    situational- concerns about her weight and health  . Edema of both legs    ankles swell- pt attributes edema to her weight  . GERD (gastroesophageal reflux disease)   . Headache(784.0)    Sinsus  . Heart murmur   . History of blood transfusion    as a child, transfusion post tonsillectomy- also states that she has areas inher nose that has bleeding tendencies   . History of hiatal hernia   . Hypertension   . Seasonal allergies   . 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      Past Surgical History:  Past Surgical History:  Procedure Laterality Date  . ABDOMINAL HYSTERECTOMY  2006  . APPENDECTOMY  1966  . BACK SURGERY    . CESAREAN SECTION     x 2  . Colonscopy    . EYE SURGERY     bil eyes  . Goiter    . Goiter  2008   Benign  . LUMBAR LAMINECTOMY/DECOMPRESSION MICRODISCECTOMY  08/19/2012   Procedure: LUMBAR LAMINECTOMY/DECOMPRESSION MICRODISCECTOMY 3 LEVELS;  Surgeon: Hewitt Shorts, MD;  Location: [facility];  Service:  Neurosurgery;  Laterality: N/A;  Lumbar two-three, Lumbar three-four,Lumbar four-five laminectomies  . TONSILLECTOMY  1948  . TOTAL HIP ARTHROPLASTY Left 08/08/2015   Procedure: LEFT TOTAL HIP ARTHROPLASTY ANTERIOR APPROACH;  Surgeon: Kathryne Hitch, MD;  Location: [facility];  Service: Orthopedics;  Laterality: Left;  . TOTAL HIP ARTHROPLASTY Right 07/11/2016   Procedure: RIGHT TOTAL HIP ARTHROPLASTY ANTERIOR APPROACH;  Surgeon: Kathryne Hitch, MD;  Location: [facility];  Service: Orthopedics;  Laterality: Right;      Social History:  reports that she quit smoking about 5 years ago. Her smoking use included e-cigarettes. She has a 56.00 pack-year smoking history. She has never used smokeless tobacco. She reports that she does not drink alcohol and does not use drugs.  Family History: family history includes Hyperlipidemia in her father; Hypertension in her mother; Stroke in her father.   HOME MEDICATIONS: Allergies as of 08/21/2020   No Known Allergies     Medication List       Accurate as of August 20, 2020  3:27 PM. If you have any questions, ask your nurse or doctor.        aspirin EC 81 MG tablet Take 1 tablet (81 mg total) by mouth daily.  diphenhydrAMINE 25 mg capsule Commonly known as: BENADRYL Take 25 mg by mouth at bedtime.   lisinopril-hydrochlorothiazide 20-12.5 MG tablet Commonly known as: ZESTORETIC TAKE 1 TABLET BY MOUTH DAILY   methocarbamol 500 MG tablet Commonly known as: ROBAXIN TAKE 1 TABLET(500 MG) BY MOUTH AT BEDTIME   methylPREDNISolone 4 MG tablet Commonly known as: Medrol Take as directed   PRO-BIOTIC BLEND PO Take by mouth.   simvastatin 10 MG tablet Commonly known as: ZOCOR Take by mouth.         REVIEW OF SYSTEMS: A comprehensive ROS was conducted with the patient and is negative except as per HPI and below:  ROS     OBJECTIVE:  VS: BP 130/60 (BP Location: Left Arm, Patient Position: Sitting, Cuff Size: Normal)   Pulse  67   Temp (!) 97.5 F (36.4 C) (Oral)   Ht 5\' 4"  (1.626 m)   Wt 215 lb 12.8 oz (97.9 kg)   SpO2 92%   BMI 37.04 kg/m    Wt Readings from Last 3 Encounters:  03/30/20 209 lb 12.8 oz (95.2 kg)  02/02/19 207 lb (93.9 kg)  07/31/16 200 lb (90.7 kg)     EXAM: General: Pt appears well and is in NAD  Neck: General: Supple without adenopathy. Thyroid: Thyroid size normal.  No goiter or nodules appreciated  Lungs: Clear with good BS bilat with no rales, rhonchi, or wheezes  Heart: Auscultation: RRR.  Abdomen: Normoactive bowel sounds, soft, nontender, without masses or organomegaly palpable  Extremities:  BL LE: No pretibial edema normal ROM and strength.  Skin: Hair: Texture and amount normal with gender appropriate distribution Skin Inspection: No rashes Skin Palpation: Skin temperature, texture, and thickness normal to palpation  Neuro: Cranial nerves: II - XII grossly intact  Motor: Normal strength throughout DTRs: 2+ and symmetric in UE without delay in relaxation phase  Mental Status: Judgment, insight: Intact Orientation: Oriented to time, place, and person Mood and affect: No depression, anxiety, or agitation     DATA REVIEWED: Results for Amanda Ponce, Amanda Ponce (MRN [name]) as of 08/22/2020 13:50  Ref. Range 08/21/2020 14:27  TSH Latest Ref Range: 0.35 - 4.50 uIU/mL 1.12  T4,Free(Direct) Latest Ref Range: 0.60 - 1.60 ng/dL 08/23/2020    Left lobe pathology 08/15/2006  THYROID, LEFT LOBE, LOBECTOMY: COLLOID GOITER WITH CHRONIC  INFLAMMATION AND EVIDENCE OF PREVIOUS HEMORRHAGE. NO EVIDENCE OF  MALIGNANCY IDENTIFIED.   ASSESSMENT/PLAN/RECOMMENDATIONS:   1. Multinodular Goiter:   - This is a chronic issues, had evidence of it since 2006. S/P left lobectomy with benign pathology  - No local neck symptoms - Biochemically euthyroid  - Will proceed with thyroid ultrasound - Reassure provided   F/u in 1 yr   Signed electronically by: 2007, MD  [facility]  Endocrinology  [facility] [address] [place] [place], [place] Phone: [phone] FAX: [phone]   CC: [phone], MD [address] [place] Phone: [phone] Fax: [phone]   Return to Endocrinology clinic as below: Future Appointments  Date Time Provider Department Center  08/21/2020  1:20 PM Rhondalyn Clingan, 08/23/2020, MD [facility] None  09/06/2020  1:30 PM 11/06/2020, MD [facility] None  03/30/2021  2:00 PM 04/01/2021, MD [facility] None

## 2020-08-21 ENCOUNTER — Other Ambulatory Visit (INDEPENDENT_AMBULATORY_CARE_PROVIDER_SITE_OTHER): Payer: Medicare HMO

## 2020-08-21 ENCOUNTER — Other Ambulatory Visit: Payer: Self-pay

## 2020-08-21 ENCOUNTER — Ambulatory Visit: Payer: Medicare HMO | Admitting: Internal Medicine

## 2020-08-21 VITALS — BP 130/60 | HR 67 | Temp 97.5°F | Ht 64.0 in | Wt 215.8 lb

## 2020-08-21 DIAGNOSIS — E042 Nontoxic multinodular goiter: Secondary | ICD-10-CM

## 2020-08-21 LAB — T4, FREE: Free T4: 0.77 ng/dL (ref 0.60–1.60)

## 2020-08-21 LAB — TSH: TSH: 1.12 u[IU]/mL (ref 0.35–4.50)

## 2020-08-21 NOTE — Patient Instructions (Signed)
-   Please stop by the lab to check your thyroid function - I have ordered a thyroid ultrasound

## 2020-08-22 DIAGNOSIS — E042 Nontoxic multinodular goiter: Secondary | ICD-10-CM | POA: Insufficient documentation

## 2020-08-27 NOTE — Progress Notes (Signed)
[name] - 84 y.o. female MRN [id number]  Date of birth: Oct 11, 1936  Office Visit Note: Visit Date: 08/16/2020 PCP: [name], MD Referred by: [name], MD  Subjective: Chief Complaint  Patient presents with  . Lower Back - Pain   HPI:  LAJUAN Ponce is a 84 y.o. female who comes in today at the request of Dr. Doneen Poisson for planned Right L3-L4 L4-L5 Lumbar facet/medial branch block with fluoroscopic guidance.  The patient has failed conservative care including home exercise, medications, time and activity modification.  This injection will be diagnostic and hopefully therapeutic.  Please see requesting physician notes for further details and justification.  Exam has shown concordant pain with facet joint loading.  MRI reviewed with images and spine model.  MRI reviewed in the note below.  Diagnostic medial branch blocks will be done with a double block paradigm if successful.  She has failed all other manner of conservative care and she would meet criteria for radiofrequency ablation is more definitive treatment.  This will be provided as part of a comprehensive pain management program.  This will also be provided with fluoroscopic imaging.  ROS Otherwise per HPI.  Assessment & Plan: Visit Diagnoses:  1. Spondylosis without myelopathy or radiculopathy, lumbar region     Plan: No additional findings.   Meds & Orders:  Meds ordered this encounter  Medications  . methylPREDNISolone acetate (DEPO-MEDROL) injection 80 mg  . bupivacaine (MARCAINE) 0.5 % (with pres) injection 3 mL    Orders Placed This Encounter  Procedures  . Facet Injection  . XR C-ARM NO REPORT    Follow-up: Return for Review Pain Diary.   Procedures: No procedures performed  Lumbar Diagnostic Facet Joint Nerve Block with Fluoroscopic Guidance   Patient: Amanda Ponce      Date of Birth: [birth year] MRN: [id number] PCP: [name], MD      Visit Date: 08/16/2020     Universal Protocol:    Date/Time: 09/26/216:19 PM  Consent Given By: the patient  Position: PRONE  Additional Comments: Vital signs were monitored before and after the procedure. Patient was prepped and draped in the usual sterile fashion. The correct patient, procedure, and site was verified.   Injection Procedure Details:  Procedure Site One Meds Administered:  Meds ordered this encounter  Medications  . methylPREDNISolone acetate (DEPO-MEDROL) injection 80 mg  . bupivacaine (MARCAINE) 0.5 % (with pres) injection 3 mL     Laterality: Right  Location/Site:  L3-L4 L4-L5  Needle size: 22 ga.  Needle type:spinal  Needle Placement: Oblique pedical  Findings:   -Comments: There was excellent flow of contrast along the articular pillars without intravascular flow.  Procedure Details: The fluoroscope beam is vertically oriented in AP and then obliqued 15 to 20 degrees to the ipsilateral side of the desired nerve to achieve the "Scotty dog" appearance.  The skin over the target area of the junction of the superior articulating process and the transverse process (sacral ala if blocking the L5 dorsal rami) was locally anesthetized with a 1 ml volume of 1% Lidocaine without Epinephrine.  The spinal needle was inserted and advanced in a trajectory view down to the target.   After contact with periosteum and negative aspirate for blood and CSF, correct placement without intravascular or epidural spread was confirmed by injecting 0.5 ml. of Isovue-250.  A spot radiograph was obtained of this image.    Next, a 0.5 ml. volume of the injectate  described above was injected. The needle was then redirected to the other facet joint nerves mentioned above if needed.  Prior to the procedure, the patient was given a Pain Diary which was completed for baseline measurements.  After the procedure, the patient rated their pain every 30 minutes and will continue rating at this frequency for a  total of 5 hours.  The patient has been asked to complete the Diary and return to [place] by mail, fax or hand delivered as soon as possible.   Additional Comments:  The patient tolerated the procedure well Dressing: 2 x 2 sterile gauze and Band-Aid    Post-procedure details: Patient was observed during the procedure. Post-procedure instructions were reviewed.  Patient left the clinic in stable condition.     Clinical History: MRI LUMBAR SPINE WITHOUT CONTRAST  TECHNIQUE: Multiplanar, multisequence MR imaging of the lumbar spine was performed. No intravenous contrast was administered.  COMPARISON:  04/30/2011  FINDINGS: Segmentation:  Standard.  Alignment: 3 mm retrolisthesis of L2 on L3 and L3 on L4. 2 mm retrolisthesis of L5 on S1.  Vertebrae:  No fracture, evidence of discitis, or bone lesion.  Conus medullaris and cauda equina: Conus extends to the L1 level. Conus and cauda equina appear normal.  Paraspinal and other soft tissues: No acute paraspinal abnormality.  Disc levels:  Disc spaces: Degenerative disease with disc height loss throughout the thoracolumbar spine.  T12-L1: Mild broad-based disc bulge. Mild bilateral facet arthropathy. No evidence of neural foraminal stenosis. No central canal stenosis.  L1-L2: No significant disc bulge. Mild broad-based disc bulge. Mild bilateral facet arthropathy. Mild left foraminal narrowing. Mild right foraminal narrowing. No central canal narrowing.  L2-L3: Mild broad-based disc bulge. Moderate bilateral facet arthropathy. Mild bilateral foraminal narrowing. No central canal stenosis.  L3-L4: Broad-based disc bulge. Prior laminectomy. Moderate bilateral facet arthropathy. Mild bilateral foraminal narrowing. No central canal stenosis.  L4-L5: Broad-based disc bulge. Prior laminectomy. Ankylosis of bilateral posterior elements. No evidence of neural foraminal stenosis. No central canal  stenosis.  L5-S1: Broad-based disc bulge. Mild bilateral facet arthropathy. Mild bilateral foraminal stenosis. No central canal stenosis.  IMPRESSION: 1. Lumbar spine spondylosis as described above. Overall appearance is improved compared with 04/30/2011. 2.  No acute osseous injury of the lumbar spine.   Electronically Signed   By: Elige Ko   On: 07/16/2020 10:46     Objective:  VS:  HT:    WT:   BMI:     BP:(!) 114/56  HR:89bpm  TEMP: ( )  RESP:  Physical Exam Constitutional:      General: She is not in acute distress.    Appearance: Normal appearance. She is not ill-appearing.  HENT:     Head: Normocephalic and atraumatic.     Right Ear: External ear normal.     Left Ear: External ear normal.  Eyes:     Extraocular Movements: Extraocular movements intact.  Cardiovascular:     Rate and Rhythm: Normal rate.     Pulses: Normal pulses.  Musculoskeletal:     Right lower leg: No edema.     Left lower leg: No edema.     Comments: Patient has good distal strength with no pain over the greater trochanters.  No clonus or focal weakness. Patient somewhat slow to rise from a seated position to full extension.  There is concordant right low back pain with facet loading and lumbar spine extension rotation.  There are no definitive trigger points but the patient is  somewhat tender across the lower back and PSIS.  There is no pain with hip rotation.   Skin:    Findings: No erythema, lesion or rash.  Neurological:     General: No focal deficit present.     Mental Status: She is alert and oriented to person, place, and time.     Sensory: No sensory deficit.     Motor: No weakness or abnormal muscle tone.     Coordination: Coordination normal.  Psychiatric:        Mood and Affect: Mood normal.        Behavior: Behavior normal.      Imaging: No results found.

## 2020-08-27 NOTE — Procedures (Signed)
Lumbar Diagnostic Facet Joint Nerve Block with Fluoroscopic Guidance   Patient: Amanda Ponce      Date of Birth: 04/04/1936 MRN: [id number] PCP: [name], MD      Visit Date: 08/16/2020   Universal Protocol:    Date/Time: 09/26/216:19 PM  Consent Given By: the patient  Position: PRONE  Additional Comments: Vital signs were monitored before and after the procedure. Patient was prepped and draped in the usual sterile fashion. The correct patient, procedure, and site was verified.   Injection Procedure Details:  Procedure Site One Meds Administered:  Meds ordered this encounter  Medications  . methylPREDNISolone acetate (DEPO-MEDROL) injection 80 mg  . bupivacaine (MARCAINE) 0.5 % (with pres) injection 3 mL     Laterality: Right  Location/Site:  L3-L4 L4-L5  Needle size: 22 ga.  Needle type:spinal  Needle Placement: Oblique pedical  Findings:   -Comments: There was excellent flow of contrast along the articular pillars without intravascular flow.  Procedure Details: The fluoroscope beam is vertically oriented in AP and then obliqued 15 to 20 degrees to the ipsilateral side of the desired nerve to achieve the "Scotty dog" appearance.  The skin over the target area of the junction of the superior articulating process and the transverse process (sacral ala if blocking the L5 dorsal rami) was locally anesthetized with a 1 ml volume of 1% Lidocaine without Epinephrine.  The spinal needle was inserted and advanced in a trajectory view down to the target.   After contact with periosteum and negative aspirate for blood and CSF, correct placement without intravascular or epidural spread was confirmed by injecting 0.5 ml. of Isovue-250.  A spot radiograph was obtained of this image.    Next, a 0.5 ml. volume of the injectate described above was injected. The needle was then redirected to the other facet joint nerves mentioned above if needed.  Prior to the procedure,  the patient was given a Pain Diary which was completed for baseline measurements.  After the procedure, the patient rated their pain every 30 minutes and will continue rating at this frequency for a total of 5 hours.  The patient has been asked to complete the Diary and return to [place] by mail, fax or hand delivered as soon as possible.   Additional Comments:  The patient tolerated the procedure well Dressing: 2 x 2 sterile gauze and Band-Aid    Post-procedure details: Patient was observed during the procedure. Post-procedure instructions were reviewed.  Patient left the clinic in stable condition.

## 2020-08-29 ENCOUNTER — Other Ambulatory Visit: Payer: Self-pay

## 2020-08-29 DIAGNOSIS — I10 Essential (primary) hypertension: Secondary | ICD-10-CM

## 2020-08-29 MED ORDER — LISINOPRIL-HYDROCHLOROTHIAZIDE 20-12.5 MG PO TABS: 1.0000 | ORAL_TABLET | Freq: Every day | ORAL | 2 refills | Status: DC

## 2020-08-30 ENCOUNTER — Ambulatory Visit
Admission: RE | Admit: 2020-08-30 | Discharge: 2020-08-30 | Disposition: A | Discharge status: Home / Self Care | Payer: Medicare HMO | Source: Ambulatory Visit | Attending: Internal Medicine | Admitting: Internal Medicine

## 2020-08-30 DIAGNOSIS — E042 Nontoxic multinodular goiter: Secondary | ICD-10-CM

## 2020-09-06 ENCOUNTER — Encounter: Payer: Self-pay | Admitting: Physical Medicine and Rehabilitation

## 2020-09-06 ENCOUNTER — Ambulatory Visit: Payer: Self-pay

## 2020-09-06 ENCOUNTER — Other Ambulatory Visit: Payer: Self-pay

## 2020-09-06 ENCOUNTER — Ambulatory Visit (INDEPENDENT_AMBULATORY_CARE_PROVIDER_SITE_OTHER): Payer: Medicare HMO | Admitting: Physical Medicine and Rehabilitation

## 2020-09-06 VITALS — BP 127/65 | HR 46

## 2020-09-06 DIAGNOSIS — M47816 Spondylosis without myelopathy or radiculopathy, lumbar region: Secondary | ICD-10-CM

## 2020-09-06 MED ORDER — METHYLPREDNISOLONE ACETATE 80 MG/ML IJ SUSP
80.0000 mg | Freq: Once | INTRAMUSCULAR | Status: AC
  Administered 2020-09-06: 80 mg

## 2020-09-06 NOTE — Progress Notes (Signed)
Pt state lower back pain. Pt state when she walking or standing for long time makes the pain worse. Pt state she have to lay down or sit to ease the pain. Pt has hx of inj on 08/16/20 Pt state it was good but it started to creep back up in pain.  Numeric Pain Rating Scale and Functional Assessment Average Pain 4   In the last MONTH (on 0-10 scale) has pain interfered with the following?  1. General activity like being  able to carry out your everyday physical activities such as walking, climbing stairs, carrying groceries, or moving a chair?  Rating(8)   +Driver, -BT, -Dye Allergies.

## 2020-09-11 ENCOUNTER — Telehealth: Payer: Self-pay | Admitting: Physical Medicine and Rehabilitation

## 2020-09-11 NOTE — Telephone Encounter (Signed)
Pt called stating she missed a call from [facility] in regards to an appt and would like a CB  [phone]

## 2020-09-11 NOTE — Telephone Encounter (Signed)
Called pt back and sch 11/2

## 2020-10-03 ENCOUNTER — Ambulatory Visit: Payer: Medicare HMO | Admitting: Physical Medicine and Rehabilitation

## 2020-10-03 ENCOUNTER — Ambulatory Visit: Payer: Self-pay

## 2020-10-03 ENCOUNTER — Other Ambulatory Visit: Payer: Self-pay

## 2020-10-03 ENCOUNTER — Encounter: Payer: Self-pay | Admitting: Physical Medicine and Rehabilitation

## 2020-10-03 VITALS — BP 124/69 | HR 76

## 2020-10-03 DIAGNOSIS — M47816 Spondylosis without myelopathy or radiculopathy, lumbar region: Secondary | ICD-10-CM | POA: Diagnosis not present

## 2020-10-03 MED ORDER — METHYLPREDNISOLONE ACETATE 80 MG/ML IJ SUSP: 80.0000 mg | Freq: Once | INTRAMUSCULAR | Status: DC

## 2020-10-03 NOTE — Progress Notes (Signed)
Pt state right lower back pain. Pt state when she walk and stand for along period of time it makes the pain worse. Pt state she takes pain meds and sits in her recliner to help ease the pain. Pt has hx of inj on 09/06/20 Pt state the inj was great but only lasted two days.  Numeric Pain Rating Scale and Functional Assessment Average Pain 5   In the last MONTH (on 0-10 scale) has pain interfered with the following?  1. General activity like being  able to carry out your everyday physical activities such as walking, climbing stairs, carrying groceries, or moving a chair?  Rating(5)   +Driver, -BT, -Dye Allergies.

## 2020-10-08 ENCOUNTER — Other Ambulatory Visit: Payer: Self-pay | Admitting: Orthopaedic Surgery

## 2020-10-08 NOTE — Progress Notes (Signed)
[name] - 84 y.o. female MRN [id number]  Date of birth: [birth year]  Office Visit Note: Visit Date: 09/06/2020 PCP: Amanda Inch, MD Referred by: Amanda Inch, MD  Subjective: Chief Complaint  Patient presents with  . Lower Back - Pain   HPI:  Amanda Ponce is a 84 y.o. female who comes in today for planned repeat Right L3-L4, L4-L5 Lumbar facet/medial branch block with fluoroscopic guidance.  The patient has failed conservative care including home exercise, medications, time and activity modification.  This injection will be diagnostic and hopefully therapeutic.  Please see requesting physician notes for further details and justification.  Exam shows concordant low back pain with facet joint loading and extension. Patient received more than 80% pain relief from prior injection.  This would be the second block in a diagnostic double block paradigm.     Referring:Dr. Doneen Poisson   ROS Otherwise per HPI.  Assessment & Plan: Visit Diagnoses:  1. Spondylosis without myelopathy or radiculopathy, lumbar region     Plan: No additional findings.   Meds & Orders:  Meds ordered this encounter  Medications  . methylPREDNISolone acetate (DEPO-MEDROL) injection 80 mg    Orders Placed This Encounter  Procedures  . Facet Injection  . XR C-ARM NO REPORT    Follow-up: Return for Review Pain Diary.   Procedures: No procedures performed  Lumbar Diagnostic Facet Joint Nerve Block with Fluoroscopic Guidance   Patient: Amanda Ponce      Date of Birth: 10/06/1936 MRN: [id number] PCP: Amanda Inch, MD      Visit Date: 09/06/2020   Universal Protocol:    Date/Time: 11/07/212:45 PM  Consent Given By: the patient  Position: PRONE  Additional Comments: Vital signs were monitored before and after the procedure. Patient was prepped and draped in the usual sterile fashion. The correct patient, procedure, and site was verified.   Injection Procedure  Details:   Procedure diagnoses:  1. Spondylosis without myelopathy or radiculopathy, lumbar region      Meds Administered:  Meds ordered this encounter  Medications  . methylPREDNISolone acetate (DEPO-MEDROL) injection 80 mg     Laterality: Right  Location/Site:  L3-L4 L4-L5  Needle size: 25 ga.  Needle type:spinal  Needle Placement: Oblique pedical  Findings:   -Comments: There was excellent flow of contrast along the articular pillars without intravascular flow.  Procedure Details: The fluoroscope beam is vertically oriented in AP and then obliqued 15 to 20 degrees to the ipsilateral side of the desired nerve to achieve the "Scotty dog" appearance.  The skin over the target area of the junction of the superior articulating process and the transverse process (sacral ala if blocking the L5 dorsal rami) was locally anesthetized with a 1 ml volume of 1% Lidocaine without Epinephrine.  The spinal needle was inserted and advanced in a trajectory view down to the target.   After contact with periosteum and negative aspirate for blood and CSF, correct placement without intravascular or epidural spread was confirmed by injecting 0.5 ml. of Isovue-250.  A spot radiograph was obtained of this image.    Next, a 0.5 ml. volume of the injectate described above was injected. The needle was then redirected to the other facet joint nerves mentioned above if needed.  Prior to the procedure, the patient was given a Pain Diary which was completed for baseline measurements.  After the procedure, the patient rated their pain every 30 minutes and will continue rating at this  frequency for a total of 5 hours.  The patient has been asked to complete the Diary and return to [place] by mail, fax or hand delivered as soon as possible.   Additional Comments:  The patient tolerated the procedure well Dressing: 2 x 2 sterile gauze and Band-Aid    Post-procedure details: Patient was observed during the  procedure. Post-procedure instructions were reviewed.  Patient left the clinic in stable condition.     Clinical History: MRI LUMBAR SPINE WITHOUT CONTRAST  TECHNIQUE: Multiplanar, multisequence MR imaging of the lumbar spine was performed. No intravenous contrast was administered.  COMPARISON:  04/30/2011  FINDINGS: Segmentation:  Standard.  Alignment: 3 mm retrolisthesis of L2 on L3 and L3 on L4. 2 mm retrolisthesis of L5 on S1.  Vertebrae:  No fracture, evidence of discitis, or bone lesion.  Conus medullaris and cauda equina: Conus extends to the L1 level. Conus and cauda equina appear normal.  Paraspinal and other soft tissues: No acute paraspinal abnormality.  Disc levels:  Disc spaces: Degenerative disease with disc height loss throughout the thoracolumbar spine.  T12-L1: Mild broad-based disc bulge. Mild bilateral facet arthropathy. No evidence of neural foraminal stenosis. No central canal stenosis.  L1-L2: No significant disc bulge. Mild broad-based disc bulge. Mild bilateral facet arthropathy. Mild left foraminal narrowing. Mild right foraminal narrowing. No central canal narrowing.  L2-L3: Mild broad-based disc bulge. Moderate bilateral facet arthropathy. Mild bilateral foraminal narrowing. No central canal stenosis.  L3-L4: Broad-based disc bulge. Prior laminectomy. Moderate bilateral facet arthropathy. Mild bilateral foraminal narrowing. No central canal stenosis.  L4-L5: Broad-based disc bulge. Prior laminectomy. Ankylosis of bilateral posterior elements. No evidence of neural foraminal stenosis. No central canal stenosis.  L5-S1: Broad-based disc bulge. Mild bilateral facet arthropathy. Mild bilateral foraminal stenosis. No central canal stenosis.  IMPRESSION: 1. Lumbar spine spondylosis as described above. Overall appearance is improved compared with 04/30/2011. 2.  No acute osseous injury of the lumbar  spine.   Electronically Signed   By: Elige Ko   On: 07/16/2020 10:46     Objective:  VS:  HT:    WT:   BMI:     BP:127/65  HR:(!) 46bpm  TEMP: ( )  RESP:  Physical Exam Constitutional:      General: She is not in acute distress.    Appearance: Normal appearance. She is not ill-appearing.  HENT:     Head: Normocephalic and atraumatic.     Right Ear: External ear normal.     Left Ear: External ear normal.  Eyes:     Extraocular Movements: Extraocular movements intact.  Cardiovascular:     Rate and Rhythm: Normal rate.     Pulses: Normal pulses.  Musculoskeletal:     Right lower leg: No edema.     Left lower leg: No edema.     Comments: Patient has good distal strength with no pain over the greater trochanters.  No clonus or focal weakness.Patient somewhat slow to rise from a seated position to full extension.  There is concordant low back pain with facet loading and lumbar spine extension rotation.  There are no definitive trigger points but the patient is somewhat tender across the lower back and PSIS.  There is no pain with hip rotation.   Skin:    Findings: No erythema, lesion or rash.  Neurological:     General: No focal deficit present.     Mental Status: She is alert and oriented to person, place, and time.  Sensory: No sensory deficit.     Motor: No weakness or abnormal muscle tone.     Coordination: Coordination normal.  Psychiatric:        Mood and Affect: Mood normal.        Behavior: Behavior normal.      Imaging: No results found.

## 2020-10-08 NOTE — Procedures (Signed)
Lumbar Diagnostic Facet Joint Nerve Block with Fluoroscopic Guidance   Patient: Amanda Ponce      Date of Birth: September 18, 1936 MRN: [id number] PCP: [name], MD      Visit Date: 09/06/2020   Universal Protocol:    Date/Time: 11/07/212:45 PM  Consent Given By: the patient  Position: PRONE  Additional Comments: Vital signs were monitored before and after the procedure. Patient was prepped and draped in the usual sterile fashion. The correct patient, procedure, and site was verified.   Injection Procedure Details:   Procedure diagnoses:  1. Spondylosis without myelopathy or radiculopathy, lumbar region      Meds Administered:  Meds ordered this encounter  Medications  . methylPREDNISolone acetate (DEPO-MEDROL) injection 80 mg     Laterality: Right  Location/Site:  L3-L4 L4-L5  Needle size: 25 ga.  Needle type:spinal  Needle Placement: Oblique pedical  Findings:   -Comments: There was excellent flow of contrast along the articular pillars without intravascular flow.  Procedure Details: The fluoroscope beam is vertically oriented in AP and then obliqued 15 to 20 degrees to the ipsilateral side of the desired nerve to achieve the "Scotty dog" appearance.  The skin over the target area of the junction of the superior articulating process and the transverse process (sacral ala if blocking the L5 dorsal rami) was locally anesthetized with a 1 ml volume of 1% Lidocaine without Epinephrine.  The spinal needle was inserted and advanced in a trajectory view down to the target.   After contact with periosteum and negative aspirate for blood and CSF, correct placement without intravascular or epidural spread was confirmed by injecting 0.5 ml. of Isovue-250.  A spot radiograph was obtained of this image.    Next, a 0.5 ml. volume of the injectate described above was injected. The needle was then redirected to the other facet joint nerves mentioned above if needed.  Prior  to the procedure, the patient was given a Pain Diary which was completed for baseline measurements.  After the procedure, the patient rated their pain every 30 minutes and will continue rating at this frequency for a total of 5 hours.  The patient has been asked to complete the Diary and return to [place] by mail, fax or hand delivered as soon as possible.   Additional Comments:  The patient tolerated the procedure well Dressing: 2 x 2 sterile gauze and Band-Aid    Post-procedure details: Patient was observed during the procedure. Post-procedure instructions were reviewed.  Patient left the clinic in stable condition.

## 2020-10-08 NOTE — Progress Notes (Signed)
Amanda Ponce - 84 y.o. female MRN [id number]  Date of birth: 03/30/1936  Office Visit Note: Visit Date: 10/03/2020 PCP: [name], MD Referred by: [name], MD  Subjective: Chief Complaint  Patient presents with  . Lower Back - Pain   HPI:  Amanda Ponce is a 84 y.o. female who comes in today for planned radiofrequency ablation of the Right L3-L4 L4-L5 Lumbar facet joints. This would be ablation of the corresponding medial branches and/or dorsal rami.  Patient has had double diagnostic blocks with more than 50% relief.  These are documented on pain diary.  They have had chronic back pain for quite some time, more than 3 months, which has been an ongoing situation with recalcitrant axial back pain.  They have no radicular pain.  Their axial pain is worse with standing and ambulating and on exam today with facet loading.  They have had physical therapy as well as home exercise program.  The imaging noted in the chart below indicated facet pathology. Accordingly they meet all the criteria and qualification for for radiofrequency ablation and we are going to complete this today hopefully for more longer term relief as part of comprehensive management program.   ROS Otherwise per HPI.  Assessment & Plan: Visit Diagnoses:  1. Spondylosis without myelopathy or radiculopathy, lumbar region     Plan: No additional findings.   Meds & Orders:  Meds ordered this encounter  Medications  . methylPREDNISolone acetate (DEPO-MEDROL) injection 80 mg    Orders Placed This Encounter  Procedures  . Radiofrequency,Lumbar  . XR C-ARM NO REPORT    Follow-up: Return if symptoms worsen or fail to improve.   Procedures: No procedures performed  Lumbar Facet Joint Nerve Denervation  Patient: Amanda Ponce      Date of Birth: 1936-05-28 MRN: [id number] PCP: [name], MD      Visit Date: 10/03/2020   Universal Protocol:    Date/Time: 11/07/212:48 PM  Consent Given By:  the patient  Position: PRONE  Additional Comments: Vital signs were monitored before and after the procedure. Patient was prepped and draped in the usual sterile fashion. The correct patient, procedure, and site was verified.   Injection Procedure Details:   Procedure diagnoses:  1. Spondylosis without myelopathy or radiculopathy, lumbar region      Meds Administered:  Meds ordered this encounter  Medications  . methylPREDNISolone acetate (DEPO-MEDROL) injection 80 mg     Laterality: Right  Location/Site:  L3-L4 L4-L5  Needle size: 18 G  Needle type: Radiofrequency cannula  Needle Placement: Along juncture of superior articular process and transverse pocess  Findings:  -Comments:  Procedure Details: For each desired target nerve, the corresponding transverse process (sacral ala for the L5 dorsal rami) was identified and the fluoroscope was positioned to square off the endplates of the corresponding vertebral body to achieve a true AP midline view.  The beam was then obliqued 15 to 20 degrees and caudally tilted 15 to 20 degrees to line up a trajectory along the target nerves. The skin over the target of the junction of superior articulating process and transverse process (sacral ala for the L5 dorsal rami) was infiltrated with 62ml of 1% Lidocaine without Epinephrine.  The 18 gauge 72mm active tip outer cannula was advanced in trajectory view to the target.  This procedure was repeated for each target nerve.  Then, for all levels, the outer cannula placement was fine-tuned and the position was then confirmed  with bi-planar imaging.    Test stimulation was done both at sensory and motor levels to ensure there was no radicular stimulation. The target tissues were then infiltrated with 1 ml of 1% Lidocaine without Epinephrine. Subsequently, a percutaneous neurotomy was carried out for 90 seconds at 80 degrees Celsius.  After the completion of the lesion, 1 ml of injectate was  delivered. It was then repeated for each facet joint nerve mentioned above. Appropriate radiographs were obtained to verify the probe placement during the neurotomy.   Additional Comments:  The patient tolerated the procedure well Dressing: 2 x 2 sterile gauze and Band-Aid    Post-procedure details: Patient was observed during the procedure. Post-procedure instructions were reviewed.  Patient left the clinic in stable condition.       Clinical History: MRI LUMBAR SPINE WITHOUT CONTRAST  TECHNIQUE: Multiplanar, multisequence MR imaging of the lumbar spine was performed. No intravenous contrast was administered.  COMPARISON:  04/30/2011  FINDINGS: Segmentation:  Standard.  Alignment: 3 mm retrolisthesis of L2 on L3 and L3 on L4. 2 mm retrolisthesis of L5 on S1.  Vertebrae:  No fracture, evidence of discitis, or bone lesion.  Conus medullaris and cauda equina: Conus extends to the L1 level. Conus and cauda equina appear normal.  Paraspinal and other soft tissues: No acute paraspinal abnormality.  Disc levels:  Disc spaces: Degenerative disease with disc height loss throughout the thoracolumbar spine.  T12-L1: Mild broad-based disc bulge. Mild bilateral facet arthropathy. No evidence of neural foraminal stenosis. No central canal stenosis.  L1-L2: No significant disc bulge. Mild broad-based disc bulge. Mild bilateral facet arthropathy. Mild left foraminal narrowing. Mild right foraminal narrowing. No central canal narrowing.  L2-L3: Mild broad-based disc bulge. Moderate bilateral facet arthropathy. Mild bilateral foraminal narrowing. No central canal stenosis.  L3-L4: Broad-based disc bulge. Prior laminectomy. Moderate bilateral facet arthropathy. Mild bilateral foraminal narrowing. No central canal stenosis.  L4-L5: Broad-based disc bulge. Prior laminectomy. Ankylosis of bilateral posterior elements. No evidence of neural foraminal stenosis.  No central canal stenosis.  L5-S1: Broad-based disc bulge. Mild bilateral facet arthropathy. Mild bilateral foraminal stenosis. No central canal stenosis.  IMPRESSION: 1. Lumbar spine spondylosis as described above. Overall appearance is improved compared with 04/30/2011. 2.  No acute osseous injury of the lumbar spine.   Electronically Signed   By: Elige Ko   On: 07/16/2020 10:46     Objective:  VS:  HT:    WT:   BMI:     BP:124/69  HR:76bpm  TEMP: ( )  RESP:  Physical Exam Constitutional:      General: She is not in acute distress.    Appearance: Normal appearance. She is not ill-appearing.  HENT:     Head: Normocephalic and atraumatic.     Right Ear: External ear normal.     Left Ear: External ear normal.  Eyes:     Extraocular Movements: Extraocular movements intact.  Cardiovascular:     Rate and Rhythm: Normal rate.     Pulses: Normal pulses.  Musculoskeletal:     Right lower leg: No edema.     Left lower leg: No edema.     Comments: Patient has good distal strength with no pain over the greater trochanters.  No clonus or focal weakness.Patient somewhat slow to rise from a seated position to full extension.  There is concordant low back pain with facet loading and lumbar spine extension rotation.  There are no definitive trigger points but the patient is  somewhat tender across the lower back and PSIS.  There is no pain with hip rotation.   Skin:    Findings: No erythema, lesion or rash.  Neurological:     General: No focal deficit present.     Mental Status: She is alert and oriented to person, place, and time.     Sensory: No sensory deficit.     Motor: No weakness or abnormal muscle tone.     Coordination: Coordination normal.  Psychiatric:        Mood and Affect: Mood normal.        Behavior: Behavior normal.      Imaging: No results found.

## 2020-10-08 NOTE — Procedures (Signed)
Lumbar Facet Joint Nerve Denervation  Patient: Amanda Ponce      Date of Birth: 1935/12/20 MRN: [id number] PCP: [name], MD      Visit Date: 10/03/2020   Universal Protocol:    Date/Time: 11/07/212:48 PM  Consent Given By: the patient  Position: PRONE  Additional Comments: Vital signs were monitored before and after the procedure. Patient was prepped and draped in the usual sterile fashion. The correct patient, procedure, and site was verified.   Injection Procedure Details:   Procedure diagnoses:  1. Spondylosis without myelopathy or radiculopathy, lumbar region      Meds Administered:  Meds ordered this encounter  Medications  . methylPREDNISolone acetate (DEPO-MEDROL) injection 80 mg     Laterality: Right  Location/Site:  L3-L4 L4-L5  Needle size: 18 G  Needle type: Radiofrequency cannula  Needle Placement: Along juncture of superior articular process and transverse pocess  Findings:  -Comments:  Procedure Details: For each desired target nerve, the corresponding transverse process (sacral ala for the L5 dorsal rami) was identified and the fluoroscope was positioned to square off the endplates of the corresponding vertebral body to achieve a true AP midline view.  The beam was then obliqued 15 to 20 degrees and caudally tilted 15 to 20 degrees to line up a trajectory along the target nerves. The skin over the target of the junction of superior articulating process and transverse process (sacral ala for the L5 dorsal rami) was infiltrated with 75ml of 1% Lidocaine without Epinephrine.  The 18 gauge 48mm active tip outer cannula was advanced in trajectory view to the target.  This procedure was repeated for each target nerve.  Then, for all levels, the outer cannula placement was fine-tuned and the position was then confirmed with bi-planar imaging.    Test stimulation was done both at sensory and motor levels to ensure there was no radicular  stimulation. The target tissues were then infiltrated with 1 ml of 1% Lidocaine without Epinephrine. Subsequently, a percutaneous neurotomy was carried out for 90 seconds at 80 degrees Celsius.  After the completion of the lesion, 1 ml of injectate was delivered. It was then repeated for each facet joint nerve mentioned above. Appropriate radiographs were obtained to verify the probe placement during the neurotomy.   Additional Comments:  The patient tolerated the procedure well Dressing: 2 x 2 sterile gauze and Band-Aid    Post-procedure details: Patient was observed during the procedure. Post-procedure instructions were reviewed.  Patient left the clinic in stable condition.

## 2020-10-17 ENCOUNTER — Encounter: Payer: Self-pay | Admitting: Physical Medicine and Rehabilitation

## 2020-12-25 ENCOUNTER — Other Ambulatory Visit: Payer: Self-pay | Admitting: Orthopaedic Surgery

## 2020-12-25 ENCOUNTER — Telehealth: Payer: Self-pay

## 2020-12-25 MED ORDER — METHOCARBAMOL 500 MG PO TABS: 500.0000 mg | ORAL_TABLET | Freq: Three times a day (TID) | ORAL | 1 refills | Status: DC | PRN

## 2020-12-25 NOTE — Telephone Encounter (Signed)
Request refill of Robaxin [facility]

## 2021-03-04 ENCOUNTER — Other Ambulatory Visit: Payer: Self-pay | Admitting: Cardiology

## 2021-03-04 DIAGNOSIS — I10 Essential (primary) hypertension: Secondary | ICD-10-CM

## 2021-03-15 ENCOUNTER — Encounter: Payer: Self-pay | Admitting: Physical Medicine and Rehabilitation

## 2021-03-30 ENCOUNTER — Ambulatory Visit: Payer: Medicare HMO | Admitting: Cardiology

## 2021-04-03 ENCOUNTER — Other Ambulatory Visit: Payer: Self-pay

## 2021-04-03 DIAGNOSIS — I728 Aneurysm of other specified arteries: Secondary | ICD-10-CM

## 2021-04-14 ENCOUNTER — Other Ambulatory Visit: Payer: Self-pay | Admitting: Orthopaedic Surgery

## 2021-04-16 DIAGNOSIS — L219 Seborrheic dermatitis, unspecified: Secondary | ICD-10-CM | POA: Insufficient documentation

## 2021-04-17 ENCOUNTER — Ambulatory Visit: Payer: Medicare HMO | Admitting: Vascular Surgery

## 2021-04-17 ENCOUNTER — Other Ambulatory Visit: Payer: Self-pay

## 2021-04-17 ENCOUNTER — Encounter: Payer: Self-pay | Admitting: Vascular Surgery

## 2021-04-17 ENCOUNTER — Ambulatory Visit (HOSPITAL_COMMUNITY)
Admission: RE | Admit: 2021-04-17 | Discharge: 2021-04-17 | Disposition: A | Discharge status: Home / Self Care | Payer: Medicare HMO | Source: Ambulatory Visit | Attending: Vascular Surgery | Admitting: Vascular Surgery

## 2021-04-17 VITALS — BP 138/72 | HR 76 | Temp 97.3°F | Resp 20 | Ht 64.0 in | Wt 219.0 lb

## 2021-04-17 DIAGNOSIS — I728 Aneurysm of other specified arteries: Secondary | ICD-10-CM | POA: Insufficient documentation

## 2021-04-17 NOTE — Progress Notes (Signed)
Patient ID: Amanda Ponce, female   DOB: 19-Dec-1935, 85 y.o.   MRN: [id number]  Reason for Consult: New Patient (Initial Visit)   Referred by [name], MD  Subjective:     HPI:  Amanda Ponce is a 85 y.o. female without significant vascular disease other than known splenic artery aneurysm.  This was evaluated 1 year ago at [facility].  Patient has requested her care be transferred to [facility].  CT scan was from 4 years ago now.  At last visit in [facility] she was recommended for ultrasound.  She presents today with that.  She has no new back or abdominal pain.  States that she has gained weight during COVID.  She does remain somewhat active walking she is currently moving out of her loft into an apartment.  She does not have any previous history of vascular disease.  Past Medical History:  Diagnosis Date  . Allergy   . Aortic stenosis   . Arthritis    OA - L hip  . Depression    situational- concerns about her weight and health  . Edema of both legs    ankles swell- pt attributes edema to her weight  . GERD (gastroesophageal reflux disease)   . Headache(784.0)    Sinsus  . Heart murmur   . History of blood transfusion    as a child, transfusion post tonsillectomy- also states that she has areas inher nose that has bleeding tendencies   . History of hiatal hernia   . Hypertension   . Seasonal allergies   . 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     Family History  Problem Relation Age of Onset  . Hypertension Mother   . Stroke Father   . Hyperlipidemia Father    Past Surgical History:  Procedure Laterality Date  . ABDOMINAL HYSTERECTOMY  2006  . APPENDECTOMY  1966  . BACK SURGERY    . CESAREAN SECTION     x 2  . Colonscopy    . EYE SURGERY     bil eyes  . Goiter    . Goiter  2008   Benign  . LUMBAR LAMINECTOMY/DECOMPRESSION MICRODISCECTOMY  08/19/2012   Procedure: LUMBAR LAMINECTOMY/DECOMPRESSION MICRODISCECTOMY 3  LEVELS;  Surgeon: Hewitt Shorts, MD;  Location: [facility];  Service: Neurosurgery;  Laterality: N/A;  Lumbar two-three, Lumbar three-four,Lumbar four-five laminectomies  . TONSILLECTOMY  1948  . TOTAL HIP ARTHROPLASTY Left 08/08/2015   Procedure: LEFT TOTAL HIP ARTHROPLASTY ANTERIOR APPROACH;  Surgeon: Kathryne Hitch, MD;  Location: [facility] OR;  Service: Orthopedics;  Laterality: Left;  . TOTAL HIP ARTHROPLASTY Right 07/11/2016   Procedure: RIGHT TOTAL HIP ARTHROPLASTY ANTERIOR APPROACH;  Surgeon: Kathryne Hitch, MD;  Location: [facility];  Service: Orthopedics;  Laterality: Right;    Short Social History:  Social History   Tobacco Use  . Smoking status: Former Smoker    Packs/day: 1.00    Years: 56.00    Pack years: 56.00    Types: E-cigarettes    Quit date: 11/25/2014    Years since quitting: 6.3  . Smokeless tobacco: Never Used  . Tobacco comment: pt. states she "VAPES"- but my breathing has improved.   Substance Use Topics  . Alcohol use: No    Allergies  Allergen Reactions  . Statins     Other reaction(s): Other achy    Current Outpatient Medications  Medication Sig Dispense  Refill  . albuterol (VENTOLIN HFA) 108 (90 Base) MCG/ACT inhaler Inhale into the lungs.    . budesonide-formoterol (SYMBICORT) 80-4.5 MCG/ACT inhaler Inhale 2 puffs into the lungs 2 (two) times daily.    . diphenhydrAMINE (BENADRYL) 25 mg capsule Take 25 mg by mouth at bedtime.    . Evolocumab 140 MG/ML SOSY Inject into the skin.    Marland Kitchen lisinopril-hydrochlorothiazide (ZESTORETIC) 20-12.5 MG tablet TAKE 1 TABLET BY MOUTH DAILY 90 tablet 2  . methocarbamol (ROBAXIN) 500 MG tablet TAKE 1 TABLET(500 MG) BY MOUTH EVERY 8 HOURS AS NEEDED FOR MUSCLE SPASMS 40 tablet 1  . Probiotic Product (PRO-BIOTIC BLEND PO) Take by mouth.     No current facility-administered medications for this visit.    Review of Systems  Constitutional:  Constitutional negative. HENT: HENT negative.  Eyes: Eyes  negative.  Respiratory: Respiratory negative.  Cardiovascular: Cardiovascular negative.  GI: Gastrointestinal negative.  Musculoskeletal: Musculoskeletal negative.  Skin: Skin negative.  Neurological: Neurological negative. Hematologic: Hematologic/lymphatic negative.  Psychiatric: Psychiatric negative.        Objective:  Objective   Vitals:   04/17/21 0943  BP: 138/72  Pulse: 76  Resp: 20  Temp: (!) 97.3 F (36.3 C)  SpO2: 96%  Weight: 219 lb (99.3 kg)  Height: 5\' 4"  (1.626 m)   Body mass index is 37.59 kg/m.  Physical Exam HENT:     Head: Normocephalic.     Nose:     Comments: Wearing a mask Eyes:     Pupils: Pupils are equal, round, and reactive to light.  Neck:     Vascular: No carotid bruit.  Cardiovascular:     Rate and Rhythm: Normal rate.     Pulses: Normal pulses.  Pulmonary:     Effort: Pulmonary effort is normal.  Abdominal:     General: Abdomen is flat.     Palpations: Abdomen is soft. There is no mass.  Musculoskeletal:        General: No swelling. Normal range of motion.  Skin:    General: Skin is warm and dry.     Capillary Refill: Capillary refill takes less than 2 seconds.  Neurological:     General: No focal deficit present.     Mental Status: She is alert.  Psychiatric:        Mood and Affect: Mood normal.        Behavior: Behavior normal.        Thought Content: Thought content normal.        Judgment: Judgment normal.     Data: Duplex Findings:    Mesenteric Technologist observations:     Summary:  Mesenteric:    Nondiagnostic exam due to excessive bowel gas. Abdominal arteries were  unable to be visualized.      Assessment/Plan:      85 year old female with small incidentally found splenic artery aneurysm that was on CT scan 4 years ago.  She was scheduled for duplex at [facility] this does not appear to have been performed since her visit 1 year ago.  On our visit today we could not identify the splenic artery aneurysm  by duplex given patient's body habitus and bowel gas pattern.  We will plan for CT scan.  She is agreeable for 6 more months prior to CT and this can be done without contrast if necessary as there is currently a [facility] of available contrast.  I discussed the possible outcomes of the CT including stable size of the aneurysm at  which time we could likely monitor every few years.  We discussed the signs and symptoms of rupture of aneurysm and she demonstrates good understanding.    Maeola Harman MD [facility]

## 2021-04-24 ENCOUNTER — Other Ambulatory Visit: Payer: Self-pay

## 2021-04-24 ENCOUNTER — Encounter: Payer: Self-pay | Admitting: Cardiology

## 2021-04-24 ENCOUNTER — Ambulatory Visit: Payer: Medicare HMO | Admitting: Cardiology

## 2021-04-24 VITALS — BP 134/60 | HR 95 | Temp 97.6°F | Resp 16 | Ht 64.0 in | Wt 221.0 lb

## 2021-04-24 DIAGNOSIS — I10 Essential (primary) hypertension: Secondary | ICD-10-CM

## 2021-04-24 DIAGNOSIS — I350 Nonrheumatic aortic (valve) stenosis: Secondary | ICD-10-CM

## 2021-04-24 DIAGNOSIS — E782 Mixed hyperlipidemia: Secondary | ICD-10-CM

## 2021-04-24 DIAGNOSIS — I2510 Atherosclerotic heart disease of native coronary artery without angina pectoris: Secondary | ICD-10-CM

## 2021-04-24 DIAGNOSIS — R0989 Other specified symptoms and signs involving the circulatory and respiratory systems: Secondary | ICD-10-CM

## 2021-04-24 DIAGNOSIS — R0609 Other forms of dyspnea: Secondary | ICD-10-CM

## 2021-04-24 DIAGNOSIS — R0600 Dyspnea, unspecified: Secondary | ICD-10-CM

## 2021-04-24 MED ORDER — ASPIRIN 81 MG PO CHEW: 81.0000 mg | CHEWABLE_TABLET | Freq: Every day | ORAL | Status: DC

## 2021-04-24 NOTE — Progress Notes (Signed)
Primary Physician/Referring:  Amanda Inch, MD  Patient ID: Amanda Ponce, female    DOB: May 08, 1936, 85 y.o.   MRN: [id number]  Chief Complaint  Patient presents with  . Nonrheumatic aortic valve stenosis  . Hypertension  . Follow-up   HPI:    Amanda Ponce  is a 85 y.o. female with mild hypertension, chronic tobacco use and now using Vape,  splenic artery aneurysm, coronary and aortic atherosclerosis by CT scan of the chest, mild aortic stenosis and chronic dyspnea presents here for annual visit.  No significant change in symptoms, due to hyperlipidemia she has been started on Repatha  by her PCP.  No chest pain, no palpitations, no leg edema or PND.  No recent weight changes.    She has COPD and chronic dyspnea.  Denies symptoms of claudication or TIA.  Past Medical History:  Diagnosis Date  . Allergy   . Aortic stenosis   . Arthritis    OA - L hip  . Depression    situational- concerns about her weight and health  . Edema of both legs    ankles swell- pt attributes edema to her weight  . GERD (gastroesophageal reflux disease)   . Headache(784.0)    Sinsus  . Heart murmur   . History of blood transfusion    as a child, transfusion post tonsillectomy- also states that she has areas inher nose that has bleeding tendencies   . History of hiatal hernia   . Hypertension   . Seasonal allergies   . 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     Past Surgical History:  Procedure Laterality Date  . ABDOMINAL HYSTERECTOMY  2006  . APPENDECTOMY  1966  . BACK SURGERY    . CESAREAN SECTION     x 2  . Colonscopy    . EYE SURGERY     bil eyes  . Goiter    . Goiter  2008   Benign  . LUMBAR LAMINECTOMY/DECOMPRESSION MICRODISCECTOMY  08/19/2012   Procedure: LUMBAR LAMINECTOMY/DECOMPRESSION MICRODISCECTOMY 3 LEVELS;  Surgeon: Hewitt Shorts, MD;  Location: [facility];  Service: Neurosurgery;  Laterality: N/A;  Lumbar two-three,  Lumbar three-four,Lumbar four-five laminectomies  . TONSILLECTOMY  1948  . TOTAL HIP ARTHROPLASTY Left 08/08/2015   Procedure: LEFT TOTAL HIP ARTHROPLASTY ANTERIOR APPROACH;  Surgeon: Kathryne Hitch, MD;  Location: [facility] OR;  Service: Orthopedics;  Laterality: Left;  . TOTAL HIP ARTHROPLASTY Right 07/11/2016   Procedure: RIGHT TOTAL HIP ARTHROPLASTY ANTERIOR APPROACH;  Surgeon: Kathryne Hitch, MD;  Location: [facility];  Service: Orthopedics;  Laterality: Right;   Family History  Problem Relation Age of Onset  . Hypertension Mother   . Stroke Father   . Hyperlipidemia Father     Social History   Tobacco Use  . Smoking status: Former Smoker    Packs/day: 1.00    Years: 56.00    Pack years: 56.00    Types: E-cigarettes    Quit date: 11/25/2014    Years since quitting: 6.4  . Smokeless tobacco: Never Used  . Tobacco comment: pt. states she "VAPES"- but my breathing has improved.   Substance Use Topics  . Alcohol use: No   Marital Status: Divorced  ROS  Review of Systems  Cardiovascular: Positive for dyspnea on exertion and leg swelling. Negative for chest pain.  Musculoskeletal: Positive for joint pain.  Gastrointestinal: Positive for heartburn. Negative for  melena.   Objective  Blood pressure 134/60, pulse 95, temperature 97.6 F (36.4 C), temperature source Temporal, resp. rate 16, height $RemoveBe'5\' 4"'nWruGAKXB$  (9.179 m), weight 221 lb (100.2 kg), SpO2 94 %.  Vitals with BMI 04/24/2021 04/17/2021 10/03/2020  Height $Remov'5\' 4"'lsUARf$  $Remove'5\' 4"'OEBmRxF$  -  Weight 221 lbs 219 lbs -  BMI 15.05 69.79 -  Systolic 480 165 537  Diastolic 60 72 69  Pulse 95 76 76     Physical Exam Constitutional:      Comments: Morderately Obese  HENT:     Head: Atraumatic.  Neck:     Vascular: No JVD.  Cardiovascular:     Rate and Rhythm: Normal rate and regular rhythm.     Pulses: Intact distal pulses.          Carotid pulses are on the right side with bruit and on the left side with bruit.      Dorsalis pedis pulses are  2+ on the right side and 2+ on the left side.       Posterior tibial pulses are 2+ on the right side and 0 on the left side.     Heart sounds: Murmur heard.   Midsystolic murmur is present with a grade of 2/6 at the upper right sternal border radiating to the neck. No gallop.      Comments: No JVD, Trace bilateral leg edema. Pulmonary:     Effort: Pulmonary effort is normal. No accessory muscle usage or respiratory distress.     Breath sounds: Normal breath sounds.  Abdominal:     General: Bowel sounds are normal.     Palpations: Abdomen is soft.     Comments: obese    Laboratory examination:   External labs:   Labs 03/12/2021:  Total cholesterol 190, triglycerides 127, HDL 44, LDL 123.  Non-HDL cholesterol 146.  Serum glucose 83 mg, BUN 23, creatinine 1.13, EGFR 49 mL, potassium 4.7.    TSH normal.  03/27/2020:   Glucose 99, BUN 22, eGFR 49, BUN/Creatinine 21, Na 138,  K 5.1, Total Protein 7.1, Albumin, Serum 4.5, AST 16, ALT 11. WBC 10.7, RBC 4.39, H/H 13.6/40.9, MCV 93, Platelets 288.   Lipid Panel: Cholesterol 197, Triglycerides 219, HDL 36, VLDL 39, LDL 122. NT-proBNP 94. TSH normal.   03/18/2018: Total cholesterol 212, triglycerides 191, HDL 39, LDL 135, non-HDL cholesterol 173.  BUN 18, creatinine 0.99, potassium 3.2, eGFR 54 mL.  CBC normal.  TSH normal.  Medications and allergies   Allergies  Allergen Reactions  . Statins     Other reaction(s): Other achy     Current Outpatient Medications on File Prior to Visit  Medication Sig Dispense Refill  . albuterol (VENTOLIN HFA) 108 (90 Base) MCG/ACT inhaler Inhale into the lungs.    . budesonide-formoterol (SYMBICORT) 80-4.5 MCG/ACT inhaler Inhale 2 puffs into the lungs 2 (two) times daily.    . diphenhydrAMINE (BENADRYL) 25 mg capsule Take 25 mg by mouth at bedtime.    . Evolocumab 140 MG/ML SOSY Inject into the skin.    Marland Kitchen lisinopril-hydrochlorothiazide (ZESTORETIC) 20-12.5 MG tablet TAKE 1 TABLET BY MOUTH DAILY  90 tablet 2  . methocarbamol (ROBAXIN) 500 MG tablet TAKE 1 TABLET(500 MG) BY MOUTH EVERY 8 HOURS AS NEEDED FOR MUSCLE SPASMS 40 tablet 1  . Probiotic Product (PRO-BIOTIC BLEND PO) Take by mouth.     No current facility-administered medications on file prior to visit.      Radiology:   Low-dose CT scan of the  chest 12/60/2018 comparison 06/09/2017: Cardiovascular: Advanced aortic and branch vessel atherosclerosis. Borderline cardiomegaly with lipomatous hypertrophy of the interatrial septum. Multivessel coronary artery atherosclerosis. No change in size of a simple appearing cystic lesion within the anterior mediastinum or associated with the pericardium. 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 Per consensus criteria, presuming the patient is asymptomatic, this requires no further imaging workup.  Cardiac Studies:   Echocardiogram 12/25/2018: Left ventricle cavity is normal in size. Concentric hypertrophy of the left ventricle. Normal global wall motion. Doppler evidence of grade I (impaired) diastolic dysfunction, elevated LAP. Calculated EF 53%. Left atrial cavity is mildly dilated. Mild aortic stenosis. Aortic valve mean gradient of 9 mmHg, Vmax of 2.1 m/s. Calculated aortic valve area by continuity equation is 1.3 cm. Mild (Grade I) mitral regurgitation. Complex appearing, moderate, circumferential pericardial effusion with thickening of the pericardium near RV free wall. Subtle early diastolic collapse seen on M mode. With dilated IVC, tamponade physiology is possible. Recommend clinical correlation. Compared to available outside echocardiogram from 2017, aortic stenosis is unchanged. Pericardial effusion is new.  Treadmill exercise stress test 01/20/2019: Indication: Aorta stenosis The patient exercised on Bruce protocol for  03:05 min. Patient achieved  4.73 METS and reached HR  145 bpm, which is  103 % of maximum age-predicted HR.  Stress test terminated due to dyspnea. Resting EKG  demonstrates Normal sinus rhythm. ST Changes: With peak exercise there was no ST-T changes of ischemia. Arrhythmias: Occasional PVC s. BP Response to Exercise: Normal resting BP- appropriate response. Exercise capacity was below average for age. Recommendations: Continue primary/secondary prevention. Consider further evaluation for aortic stenosis if clinically indicated.  EKG  EKG 04/24/2021: Normal sinus rhythm at rate of 80 bpm, normal axis, incomplete right bundle branch block.  No evidence of ischemia, otherwise normal EKG.  No significant change from EKG 03/30/2020.   Assessment     ICD-10-CM   1. Nonrheumatic aortic valve stenosis  I35.0 EKG 12-Lead    PCV ECHOCARDIOGRAM COMPLETE  2. Dyspnea on exertion  R06.00 PCV ECHOCARDIOGRAM COMPLETE  3. Coronary artery calcification seen on CAT scan  I25.10 aspirin (ASPIRIN CHILDRENS) 81 MG chewable tablet  4. Bilateral carotid bruits  R09.89 PCV CAROTID DUPLEX (BILATERAL)  5. Essential hypertension  I10   6. Mixed hyperlipidemia  E78.2     Meds ordered this encounter  Medications  . aspirin (ASPIRIN CHILDRENS) 81 MG chewable tablet    Sig: Chew 1 tablet (81 mg total) by mouth daily.    There are no discontinued medications.  Recommendations:   Amanda Ponce  is a 85 y.o.  female with mild hypertension, chronic tobacco use and now using Vape,   small 1.1 cm splenic artery aneurysm noted by ultrasound performed by [facility] in 2018, mild COPD, coronary and aortic atherosclerosis by CT scan of the chest, mild aortic stenosis and chronic dyspnea presents here for annual visit.  No significant change in symptoms, due to hyperlipidemia she has been started on Repatha  by her PCP.  She is due for lipid profile testing next month.  I reviewed her prior CT of the chest, she has severe coronary calcification involving all 3 vessels.  Aggressive management of her risk factors is essential, agree with starting Repatha as she could not tolerate  statins.  She is vaping right now and I have recommended that she completely quit this.  Weight loss would certainly help with her dyspnea and chronic diastolic heart failure as well.  Fortunately she has not had  any recent hospitalizations.  With regard to aortic stenosis, no change in physical exam.  I will repeat echocardiogram specifically to follow-up on any right ventricular strain and also follow-up on aortic stenosis as previous echocardiogram was done 2 years ago.  No clinical evidence of heart failure.  She has bilateral carotid artery bruit, she has not had a carotid duplex, will obtain carotid duplex.  In view of severe coronary calcification noted on CT scan and also physical exam consistent with mild PAD especially left lower extremity with absent PT pulse, I recommended aspirin 81 mg daily for primary prevention.  I will see her back in a year or sooner if problems.   Adrian Prows, MD, [facility] 04/24/2021, 4:18 PM Office: [phone] Fax: [phone] Pager: [phone]

## 2021-06-13 ENCOUNTER — Ambulatory Visit: Payer: Medicare HMO

## 2021-06-13 ENCOUNTER — Other Ambulatory Visit: Payer: Self-pay

## 2021-06-13 DIAGNOSIS — R0609 Other forms of dyspnea: Secondary | ICD-10-CM

## 2021-06-13 DIAGNOSIS — I350 Nonrheumatic aortic (valve) stenosis: Secondary | ICD-10-CM

## 2021-06-13 DIAGNOSIS — R0600 Dyspnea, unspecified: Secondary | ICD-10-CM

## 2021-06-13 DIAGNOSIS — R0989 Other specified symptoms and signs involving the circulatory and respiratory systems: Secondary | ICD-10-CM

## 2021-06-18 ENCOUNTER — Other Ambulatory Visit: Payer: Self-pay | Admitting: Physician Assistant

## 2021-07-18 ENCOUNTER — Ambulatory Visit: Payer: Medicare HMO | Admitting: Orthopaedic Surgery

## 2021-08-24 ENCOUNTER — Ambulatory Visit: Payer: Medicare HMO | Admitting: Internal Medicine

## 2021-08-30 ENCOUNTER — Other Ambulatory Visit: Payer: Self-pay

## 2021-08-30 DIAGNOSIS — I728 Aneurysm of other specified arteries: Secondary | ICD-10-CM

## 2021-09-05 ENCOUNTER — Telehealth: Payer: Self-pay

## 2021-09-05 NOTE — Telephone Encounter (Signed)
-----   Message from April H Pait sent at 09/05/2021 12:32 PM EDT ----- Regarding: FW: CTA  ----- Message ----- From: Lorenza Cambridge Sent: 09/05/2021  12:17 PM EDT To: April H Pait Subject: RE: CTA                                        2ND ATTEMPT.  Left msg on vm...   ad ----- Message ----- From: Sinda Du H Sent: 09/04/2021  11:53 AM EDT To: Lorenza Cambridge Subject: FW: CTA                                         ----- Message ----- From: Rennie Natter, NT Sent: 08/30/2021   4:08 PM EDT To: April H Pait, Marylynn Pearson, # Subject: RE: CTA                                        FYI...   08/30/21-LVM.Marland Kitchen  Alvy Beal ----- Message ----- From: Sinda Du H Sent: 08/30/2021   2:53 PM EDT To: Rennie Natter, NT Subject: FW: CTA                                         ----- Message ----- From: Yolonda Kida, LPN Sent: 05/08/3709   2:04 PM EDT To: April H Pait, Servando Salina, # Subject: CTA                                            Please schedule prior to appt on 10/17/21.  Thanks.

## 2021-09-13 ENCOUNTER — Ambulatory Visit (HOSPITAL_COMMUNITY)
Admission: RE | Admit: 2021-09-13 | Discharge: 2021-09-13 | Disposition: A | Discharge status: Home / Self Care | Payer: Medicare HMO | Source: Ambulatory Visit | Attending: Vascular Surgery | Admitting: Vascular Surgery

## 2021-09-13 ENCOUNTER — Other Ambulatory Visit: Payer: Self-pay

## 2021-09-13 DIAGNOSIS — I728 Aneurysm of other specified arteries: Secondary | ICD-10-CM | POA: Insufficient documentation

## 2021-09-13 MED ORDER — IOHEXOL 350 MG/ML SOLN
100.0000 mL | Freq: Once | INTRAVENOUS | Status: AC | PRN
  Administered 2021-09-13: 100 mL via INTRAVENOUS

## 2021-10-17 ENCOUNTER — Ambulatory Visit: Payer: Medicare HMO | Admitting: Vascular Surgery

## 2021-12-04 ENCOUNTER — Other Ambulatory Visit: Payer: Self-pay | Admitting: Cardiology

## 2021-12-04 DIAGNOSIS — I10 Essential (primary) hypertension: Secondary | ICD-10-CM

## 2022-04-24 ENCOUNTER — Ambulatory Visit: Payer: Medicare HMO | Admitting: Cardiology

## 2022-04-24 ENCOUNTER — Encounter: Payer: Self-pay | Admitting: Cardiology

## 2022-04-24 VITALS — BP 122/54 | HR 82 | Temp 98.2°F | Resp 14 | Ht 64.0 in | Wt 207.4 lb

## 2022-04-24 DIAGNOSIS — I10 Essential (primary) hypertension: Secondary | ICD-10-CM

## 2022-04-24 DIAGNOSIS — G720 Drug-induced myopathy: Secondary | ICD-10-CM

## 2022-04-24 DIAGNOSIS — I951 Orthostatic hypotension: Secondary | ICD-10-CM

## 2022-04-24 DIAGNOSIS — E782 Mixed hyperlipidemia: Secondary | ICD-10-CM

## 2022-04-24 DIAGNOSIS — I350 Nonrheumatic aortic (valve) stenosis: Secondary | ICD-10-CM

## 2022-04-24 DIAGNOSIS — R634 Abnormal weight loss: Secondary | ICD-10-CM

## 2022-04-24 MED ORDER — LISINOPRIL-HYDROCHLOROTHIAZIDE 10-12.5 MG PO TABS: 1.0000 | ORAL_TABLET | ORAL | 1 refills | Status: DC

## 2022-04-24 NOTE — Progress Notes (Signed)
Primary Physician/Referring:  Aura Dials, PA-C  Patient ID: Amanda Ponce, female    DOB: 09-29-1936, 86 y.o.   MRN: [id number]  No chief complaint on file.  HPI:    Amanda Ponce  is a 86 y.o. female with mild hypertension, chronic tobacco use and now using Vape,   small 1.1 cm splenic artery aneurysm noted by ultrasound performed by [facility] in 2018 and confirmed by  CT angiogram of the abdomen in September 2022, mild COPD, coronary and aortic atherosclerosis by CT scan of the chest, mild carotid atherosclerosis and mild aortic stenosis and chronic dyspnea presents here for annual visit.  Her main complaint today was marked dizziness that started sometime in April 2023. She has COPD and chronic dyspnea.  Denies symptoms of claudication or TIA.  Past Medical History:  Diagnosis Date  . Allergy   . Aortic stenosis   . Arthritis    OA - L hip  . Depression    situational- concerns about her weight and health  . Edema of both legs    ankles swell- pt attributes edema to her weight  . GERD (gastroesophageal reflux disease)   . Headache(784.0)    Sinsus  . Heart murmur   . History of blood transfusion    as a child, transfusion post tonsillectomy- also states that she has areas inher nose that has bleeding tendencies   . History of hiatal hernia   . Hypertension   . Seasonal allergies   . 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     Social History   Tobacco Use  . Smoking status: Former    Packs/day: 1.00    Years: 56.00    Pack years: 56.00    Types: E-cigarettes, Cigarettes    Quit date: 11/25/2014    Years since quitting: 7.4  . Smokeless tobacco: Never  . Tobacco comments:    pt. states she "VAPES"- but my breathing has improved.   Substance Use Topics  . Alcohol use: No   Marital Status: Divorced  ROS  Review of Systems  Constitutional: Positive for weight loss.  Cardiovascular:  Positive for dyspnea on exertion.  Negative for chest pain and leg swelling.  Musculoskeletal:  Negative for joint pain.  Gastrointestinal:  Negative for melena.  Neurological:  Positive for dizziness.  Objective  Blood pressure (!) 122/54, pulse 82, temperature 98.2 F (36.8 C), temperature source Temporal, resp. rate 14, height 5' 4" (1.626 m), weight 207 lb 6.4 oz (94.1 kg), SpO2 97 %.     04/24/2022    3:09 PM 04/24/2022    3:07 PM 04/24/2022    2:57 PM  Vitals with BMI  Height   5' 4"  Weight   207 lbs 6 oz  BMI   97.35  Systolic 329 924 268  Diastolic 54 42 49  Pulse 82 66 93     Orthostatic VS for the past 72 hrs (Last 3 readings):  Orthostatic BP Patient Position BP Location Cuff Size Orthostatic Pulse  04/24/22 1515 113/43 Standing Left Arm Large 88  04/24/22 1513 125/48 Sitting Left Arm Large 93  04/24/22 1511 140/59 Supine Left Arm Large 84     Physical Exam Constitutional:      Comments: Morderately Obese  Neck:     Vascular: No carotid bruit or JVD.  Cardiovascular:     Rate and Rhythm: Normal rate and regular rhythm.  Pulses: Intact distal pulses.          Carotid pulses are  on the right side with bruit and  on the left side with bruit.      Dorsalis pedis pulses are 2+ on the right side and 2+ on the left side.       Posterior tibial pulses are 2+ on the right side and 0 on the left side.     Heart sounds: Murmur heard.  Midsystolic murmur is present with a grade of 2/6 at the upper right sternal border radiating to the neck.    No gallop.  Pulmonary:     Effort: Pulmonary effort is normal. No accessory muscle usage or respiratory distress.     Breath sounds: Normal breath sounds.  Abdominal:     General: Bowel sounds are normal.     Palpations: Abdomen is soft.     Comments: obese  Musculoskeletal:     Right lower leg: No edema.     Left lower leg: No edema.   Laboratory examination:   External labs:  Labs 01/29/2022:  Hb 12.6/HCT 37.9, platelets 258, normal indicis.  Total  cholesterol 192, triglycerides 184, HDL 38, LDL 121.  Non-HDL cholesterol 154.  Serum glucose 95 mg, BUN 21, creatinine 1.06, EGFR 52 mL, potassium 4.7, LFTs normal.  TSH normal at 1.210.  Medications and allergies   Allergies  Allergen Reactions  . Statins     Other reaction(s): Other achy   Current Outpatient Medications:  .  budesonide-formoterol (SYMBICORT) 80-4.5 MCG/ACT inhaler, Inhale 2 puffs into the lungs 2 (two) times daily., Disp: , Rfl:  .  Evolocumab 140 MG/ML SOSY, Inject into the skin., Disp: , Rfl:  .  glucosamine-chondroitin 500-400 MG tablet, Take 1 tablet by mouth 3 (three) times daily., Disp: , Rfl:  .  lisinopril-hydrochlorothiazide (ZESTORETIC) 10-12.5 MG tablet, Take 1 tablet by mouth every morning., Disp: 90 tablet, Rfl: 1 .  methocarbamol (ROBAXIN) 500 MG tablet, TAKE 1 TABLET(500 MG) BY MOUTH EVERY 8 HOURS AS NEEDED FOR MUSCLE SPASMS, Disp: 40 tablet, Rfl: 1 .  Probiotic Product (PRO-BIOTIC BLEND PO), Take by mouth., Disp: , Rfl:      Radiology:   Low-dose CT scan of the chest 12/60/2018 comparison 06/09/2017: Cardiovascular: Advanced aortic and branch vessel atherosclerosis. Borderline cardiomegaly with lipomatous hypertrophy of the interatrial septum. Multivessel coronary artery atherosclerosis. No change in size of a simple appearing cystic lesion within the anterior mediastinum or associated with the pericardium. 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 Per consensus criteria, presuming the patient is asymptomatic, this requires no further imaging workup.  Cardiac Studies:   Treadmill exercise stress test 01/20/2019: Indication: Aorta stenosis The patient exercised on Bruce protocol for  03:05 min. Patient achieved  4.73 METS and reached HR  145 bpm, which is  103 % of maximum age-predicted HR.  Stress test terminated due to dyspnea. Resting EKG demonstrates Normal sinus rhythm. ST Changes: With peak exercise there was no ST-T changes of ischemia.  Arrhythmias: Occasional PVC s. BP Response to Exercise: Normal resting BP- appropriate response. Exercise capacity was below average for age. Recommendations: Continue primary/secondary prevention. Consider further evaluation for aortic stenosis if clinically indicated.  Carotid artery duplex 06/13/2021:  Duplex suggests stenosis in the right internal carotid artery (1-15%). Duplex suggests stenosis in the right external carotid artery (<50%).  Duplex suggests stenosis in the left internal carotid artery (1-15%). Duplex suggests stenosis in the left external carotid artery (<50%).  Antegrade right vertebral  artery flow. Antegrade left vertebral artery flow.  External carotid artery stenosis probably source of bruit. Follow up in one year is appropriate if clinically indicated  PCV ECHOCARDIOGRAM COMPLETE 06/13/2021  Left ventricle cavity is normal in size. Moderate concentric hypertrophy of the left ventricle. Normal global wall motion. Normal LV systolic function with visual EF 50-55%. Doppler evidence of grade I (impaired) diastolic dysfunction, normal LAP.  Trileaflet aortic valve. Mild aortic stenosis.  Vmax 2.4 m/sec, mean PG 13 mmHg, AVA 1.3 cm2 by continuity equation Trace aortic regurgitation. Mild (Grade I) mitral regurgitation. Mild tricuspid regurgitation. Estimated pulmonary artery systolic pressure 24 mmHg. No significant change compared to previous study on 12/25/2018.    EKG  EKG 04/24/2022: Normal sinus rhythm at rate of 81 bpm, normal axis, incomplete right bundle branch block.  Normal EKG.  No change from 04/24/2021.  Assessment     ICD-10-CM   1. Nonrheumatic aortic valve stenosis  I35.0     2. Orthostatic hypotension  I95.1     3. Essential hypertension  I10 EKG 12-Lead    lisinopril-hydrochlorothiazide (ZESTORETIC) 10-12.5 MG tablet    4. Mixed hyperlipidemia  E78.2     5. Unintentional weight loss  R63.4     6. Statin myopathy  G72.0    T46.6X5A       Meds  ordered this encounter  Medications  . lisinopril-hydrochlorothiazide (ZESTORETIC) 10-12.5 MG tablet    Sig: Take 1 tablet by mouth every morning.    Dispense:  90 tablet    Refill:  1    Medications Discontinued During This Encounter  Medication Reason  . aspirin (ASPIRIN CHILDRENS) 81 MG chewable tablet   . diphenhydrAMINE (BENADRYL) 25 mg capsule Patient has not taken in last 30 days  . lisinopril-hydrochlorothiazide (ZESTORETIC) 20-12.5 MG tablet Dose change    Recommendations:   Amanda Ponce  is a 86 y.o.  female with mild hypertension, chronic tobacco use and now using Vape,   small 1.1 cm splenic artery aneurysm noted by ultrasound performed by [facility] in 2018 and confirmed by  CT angiogram of the abdomen in September 2022, mild COPD, coronary and aortic atherosclerosis by CT scan of the chest, mild carotid atherosclerosis and mild aortic stenosis and chronic dyspnea presents here for annual visit.  Her main complaint today was marked dizziness that started sometime in April 2023.  She was clearly orthostatic today, with a drop of >30 mmHg.  I have reduced the dose of lisinopril HCT from 20/12.5 mg to 10/12.5 mg in the morning.  She has had a weight loss of 14 pounds, recently, unexplained.  I am not sure if this indicates internal malignancy or it is an autonomic neuropathy from prior tobacco use disorder and peripheral neuropathy.  With regard to carotid atherosclerosis and coronary atherosclerosis, she has not been able to tolerate statins, presently on Repatha, she had discontinued this but is now back on Repatha and repeat labs are pending.  Her aortic stenotic murmur does not appear to have changed significantly.  Do not think there is significant progression leading to orthostasis.  Otherwise no change in her physical exam.  I will see her back on an annual basis.  I will forward my office visit note to Fulton Mole, PA-C.   This was a 38-minute office visit encounter in  review of external labs, coordination of care, discussions regarding new diagnosis of orthostasis and discussions regarding weight loss.   Adrian Prows, MD, [facility] 04/24/2022, 4:59 PM Office: [phone]  Fax: [phone] Pager: [phone]

## 2022-05-19 ENCOUNTER — Encounter: Payer: Self-pay | Admitting: *Deleted

## 2022-05-21 NOTE — Congregational Nurse Program (Signed)
  Dept: [phone]   Congregational Nurse Program Note  Date of Encounter: 05/19/2022  Past Medical History: Past Medical History:  Diagnosis Date   Allergy    Aortic stenosis    Arthritis    OA - L hip   Depression    situational- concerns about her weight and health   Edema of both legs    ankles swell- pt attributes edema to her weight   GERD (gastroesophageal reflux disease)    Headache(784.0)    Sinsus   Heart murmur    History of blood transfusion    as a child, transfusion post tonsillectomy- also states that she has areas inher nose that has bleeding tendencies    History of hiatal hernia    Hypertension    Seasonal allergies    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      Encounter Details:  CNP Questionnaire - 05/19/22 1215       Questionnaire   Do you give verbal consent to treat you today? Yes    Location Patient Served  [employer]    Visit Setting Church or Organization    Patient Status Unknown    Insurance Medicare    Insurance Referral N/A    Medication N/A    Medical Provider Yes    Screening Referrals N/A    Medical Referral N/A    Medical Appointment Made N/A    Food N/A    Transportation N/A    Housing/Utilities N/A    Interpersonal Safety N/A    Intervention Blood pressure    ED Visit Averted Yes    Life-Saving Intervention Made N/A            Client requested BP check at [facility] after Sunday service from this CN.  BP was 148/59 HR 83.  After a minute BP down to 137/60 HR 76.  Client expressed no medical concerns at this time.  Will follow up as desired.  Roderic Palau, RN, MSN, CNP [phone] Office [phone] Cell

## 2022-06-26 ENCOUNTER — Encounter: Payer: Self-pay | Admitting: Orthopaedic Surgery

## 2022-06-26 ENCOUNTER — Ambulatory Visit: Payer: Medicare HMO | Admitting: Orthopaedic Surgery

## 2022-06-26 DIAGNOSIS — M65322 Trigger finger, left index finger: Secondary | ICD-10-CM

## 2022-06-26 MED ORDER — METHYLPREDNISOLONE ACETATE 40 MG/ML IJ SUSP
40.0000 mg | INTRAMUSCULAR | Status: AC | PRN
  Administered 2022-06-26: 40 mg

## 2022-06-26 MED ORDER — LIDOCAINE HCL 1 % IJ SOLN
0.5000 mL | INTRAMUSCULAR | Status: AC | PRN
  Administered 2022-06-26: .5 mL

## 2022-06-26 NOTE — Progress Notes (Signed)
Office Visit Note   Patient: Amanda Ponce           Date of Birth: Feb 18, 1936           MRN: [id number] Visit Date: 06/26/2022              Requested by: Teena Irani, PA-C [address] [place],  [place] [address] PCP: Teena Irani, PA-C   Assessment & Plan: Visit Diagnoses:  1. Trigger finger, left index finger     Plan: I did provide a steroid injection over the left index finger A1 pulley which she tolerated well.  Follow-up further surgery can be as needed.  We can always repeat the injection in 6 to 8 weeks once more if needed versus trigger finger release.  From my standpoint of her other issues with the way she picks up items, there is nothing I can recommend for that and we could always send her to a hand specialist if needed.  Follow-Up Instructions: Return if symptoms worsen or fail to improve.   Orders:  Orders Placed This Encounter  Procedures   Hand/UE Inj   No orders of the defined types were placed in this encounter.     Procedures: Hand/UE Inj: L index A1 for trigger finger on 06/26/2022 1:17 PM Medications: 0.5 mL lidocaine 1 %; 40 mg methylPREDNISolone acetate 40 MG/ML      Clinical Data: No additional findings.   Subjective: Chief Complaint  Patient presents with   Right Hand - Pain   Left Hand - Pain  The patient comes in today with mainly left index finger pain and triggering.  She does have bilateral hand pain and feels like her thumb and index finger on both sides stayed together when she is picking up things.  However her bigger complaint is active triggering of the left index finger.  She is 86 years old and active.  HPI  Review of Systems There is no listed fever, chills, nausea, vomiting  Objective: Vital Signs: There were no vitals taken for this visit.  Physical Exam She is alert and oriented x3 and in no acute distress Ortho Exam Examination of her left hand shows no muscle atrophy.  There is pain over the index  finger A1 pulley and active triggering of the left index finger. Specialty Comments:  No specialty comments available.  Imaging: No results found.   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/11/2016   Osteoarthritis of left hip 08/08/2015   Status post total replacement of left hip 08/08/2015   Past Medical History:  Diagnosis Date   Allergy    Aortic stenosis    Arthritis    OA - L hip   Depression    situational- concerns about her weight and health   Edema of both legs    ankles swell- pt attributes edema to her weight   GERD (gastroesophageal reflux disease)    Headache(784.0)    Sinsus   Heart murmur    History of blood transfusion    as a child, transfusion post tonsillectomy- also states that she has areas inher nose that has bleeding tendencies    History of hiatal hernia    Hypertension    Seasonal allergies    Tuberculosis 1971   + react,  Pt. states that  she has been treated for one yr.      Family History  Problem Relation Age of Onset   Hypertension Mother    Stroke Father 8       1st stroke @ 29Y, and then 2 more- age unknown   Hyperlipidemia Father     Past Surgical History:  Procedure Laterality Date   ABDOMINAL HYSTERECTOMY  2006   APPENDECTOMY  1966   BACK SURGERY     CESAREAN SECTION     x 2   Colonscopy     EYE SURGERY     bil eyes   Goiter     Goiter  2008   Benign   LUMBAR LAMINECTOMY/DECOMPRESSION MICRODISCECTOMY  08/19/2012   Procedure: LUMBAR LAMINECTOMY/DECOMPRESSION MICRODISCECTOMY 3 LEVELS;  Surgeon: Hewitt Shorts, MD;  Location: [facility];  Service: Neurosurgery;  Laterality: N/A;  Lumbar two-three, Lumbar three-four,Lumbar four-five laminectomies   TONSILLECTOMY  1948   TOTAL HIP ARTHROPLASTY Left 08/08/2015   Procedure: LEFT TOTAL HIP ARTHROPLASTY ANTERIOR  APPROACH;  Surgeon: Kathryne Hitch, MD;  Location: [facility] OR;  Service: Orthopedics;  Laterality: Left;   TOTAL HIP ARTHROPLASTY Right 07/11/2016   Procedure: RIGHT TOTAL HIP ARTHROPLASTY ANTERIOR APPROACH;  Surgeon: Kathryne Hitch, MD;  Location: [facility];  Service: Orthopedics;  Laterality: Right;   Social History   Occupational History   Not on file  Tobacco Use   Smoking status: Former    Packs/day: 1.00    Years: 56.00    Total pack years: 56.00    Types: E-cigarettes, Cigarettes    Quit date: 11/25/2014    Years since quitting: 7.5   Smokeless tobacco: Never   Tobacco comments:    pt. states she "VAPES"- but my breathing has improved.   Vaping Use   Vaping Use: Never used  Substance and Sexual Activity   Alcohol use: No   Drug use: No   Sexual activity: Not Currently

## 2022-07-14 ENCOUNTER — Encounter: Payer: Self-pay | Admitting: *Deleted

## 2022-07-16 NOTE — Congregational Nurse Program (Signed)
  Dept: [phone]   Congregational Nurse Program Note  Date of Encounter: 07/14/2022  Past Medical History: Past Medical History:  Diagnosis Date   Allergy    Aortic stenosis    Arthritis    OA - L hip   Depression    situational- concerns about her weight and health   Edema of both legs    ankles swell- pt attributes edema to her weight   GERD (gastroesophageal reflux disease)    Headache(784.0)    Sinsus   Heart murmur    History of blood transfusion    as a child, transfusion post tonsillectomy- also states that she has areas inher nose that has bleeding tendencies    History of hiatal hernia    Hypertension    Seasonal allergies    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      Encounter Details:  CNP Questionnaire - 07/14/22 1230       Questionnaire   Do you give verbal consent to treat you today? Yes    Location Patient Served  [facility] [employer] [employer]    Visit Setting Church or Organization    Patient Status Unknown    Insurance Medicare    Insurance Referral N/A    Medication N/A    Medical Provider Yes    Screening Referrals N/A    Medical Referral N/A    Medical Appointment Made N/A    Food N/A    Transportation N/A    Housing/Utilities N/A    Interpersonal Safety N/A    Intervention Blood pressure    ED Visit Averted Yes    Life-Saving Intervention Made N/A            Client came in to nurse clinic after church services today.  BP 123 74 HR 82.  Client is please with BP results today.  Client will follow up with this CN as desired.  Roderic Palau, RN, MSN, CNP [phone] Office [phone] Cell

## 2022-07-19 ENCOUNTER — Ambulatory Visit (INDEPENDENT_AMBULATORY_CARE_PROVIDER_SITE_OTHER): Payer: Medicare HMO

## 2022-07-19 ENCOUNTER — Ambulatory Visit: Payer: Medicare HMO | Admitting: Podiatry

## 2022-07-19 DIAGNOSIS — M21619 Bunion of unspecified foot: Secondary | ICD-10-CM

## 2022-07-19 DIAGNOSIS — M21611 Bunion of right foot: Secondary | ICD-10-CM | POA: Diagnosis not present

## 2022-07-19 DIAGNOSIS — M2012 Hallux valgus (acquired), left foot: Secondary | ICD-10-CM

## 2022-07-19 DIAGNOSIS — M19072 Primary osteoarthritis, left ankle and foot: Secondary | ICD-10-CM

## 2022-07-19 DIAGNOSIS — M21612 Bunion of left foot: Secondary | ICD-10-CM | POA: Diagnosis not present

## 2022-07-19 NOTE — Progress Notes (Unsigned)
Subjective:  Patient ID: Amanda Ponce, female    DOB: Feb 26, 1936,  MRN: [id number]  Chief Complaint  Patient presents with   Bunions    Bunions on bilateral feet, Pain triggered by walking     86 y.o. female presents with the above complaint.  Patient presents with left severe bunion deformity with underlying osteoarthritis.  Patient states the pain for touch is progressive gotten worse hurts with ambulation.  She taking has bunions on both sides.  But the left side is much greater than right side.  She wanted get it evaluated think about options she is try conservative care none of which has helped she would like to discuss surgical options at this time   Review of Systems: Negative except as noted in the HPI. Denies N/V/F/Ch.  Past Medical History:  Diagnosis Date   Allergy    Aortic stenosis    Arthritis    OA - L hip   Depression    situational- concerns about her weight and health   Edema of both legs    ankles swell- pt attributes edema to her weight   GERD (gastroesophageal reflux disease)    Headache(784.0)    Sinsus   Heart murmur    History of blood transfusion    as a child, transfusion post tonsillectomy- also states that she has areas inher nose that has bleeding tendencies    History of hiatal hernia    Hypertension    Seasonal allergies    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      Current Outpatient Medications:    budesonide-formoterol (SYMBICORT) 80-4.5 MCG/ACT inhaler, Inhale 2 puffs into the lungs 2 (two) times daily., Disp: , Rfl:    Evolocumab 140 MG/ML SOSY, Inject into the skin., Disp: , Rfl:    glucosamine-chondroitin 500-400 MG tablet, Take 1 tablet by mouth 3 (three) times daily., Disp: , Rfl:    methocarbamol (ROBAXIN) 500 MG tablet, TAKE 1 TABLET(500 MG) BY MOUTH EVERY 8 HOURS AS NEEDED FOR MUSCLE SPASMS, Disp: 40 tablet, Rfl: 1   Probiotic Product (PRO-BIOTIC BLEND PO), Take by mouth., Disp: , Rfl:     lisinopril-hydrochlorothiazide (ZESTORETIC) 10-12.5 MG tablet, TAKE 1 TABLET BY MOUTH EVERY MORNING, Disp: 90 tablet, Rfl: 1  Social History   Tobacco Use  Smoking Status Former   Packs/day: 1.00   Years: 56.00   Total pack years: 56.00   Types: E-cigarettes, Cigarettes   Quit date: 11/25/2014   Years since quitting: 7.6  Smokeless Tobacco Never  Tobacco Comments   pt. states she "VAPES"- but my breathing has improved.     Allergies  Allergen Reactions   Statins     Other reaction(s): Other achy   Objective:  There were no vitals filed for this visit. There is no height or weight on file to calculate BMI. Constitutional Well developed. Well nourished.  Vascular Dorsalis pedis pulses palpable bilaterally. Posterior tibial pulses palpable bilaterally. Capillary refill normal to all digits.  No cyanosis or clubbing noted. Pedal hair growth normal.  Neurologic Normal speech. Oriented to person, place, and time. Epicritic sensation to light touch grossly present bilaterally.  Dermatologic Nails well groomed and normal in appearance. No open wounds. No skin lesions.  Orthopedic: Pain on palpation in left bunion deformity underlying crepitus noted pain with range of motion of the joint deep intra-articular first MPJ joint noted.  Severe underlying bunion deformity noted.  This is a  track bound not a tracking deformity   Radiographs: 3 views of skeletally mature adult bilateral foot: Severe bunion formerly noted with underlying osteoarthritis left greater than right side generalized osteopenic foot structure noted.  Pes planovalgus foot structure noted plantar and posterior heel spurring noted midfoot arthritis noted Assessment:   1. Hav (hallux abducto valgus), left   2. Arthritis of first metatarsophalangeal (MTP) joint of left foot    Plan:  Patient was evaluated and treated and all questions answered.  Bilateral bunion with left greater than right side -[place]qua signs and  concerns were discussed with the patient in extensive detail given the amount of pain that she is having with underlying osteoarthritic pleuritis I believe patient will benefit from reduction of bunion deformity via MPJ fusion due to underlying osteoarthritis I discussed this with the patient in brief detail she states understanding will think about the surgical options will get back to me if she wants to proceed with that. -Shoe gear modification orthotics management as well  No follow-ups on file.

## 2022-07-19 NOTE — Progress Notes (Unsigned)
DG  °

## 2022-07-24 ENCOUNTER — Other Ambulatory Visit: Payer: Self-pay | Admitting: Cardiology

## 2022-07-24 DIAGNOSIS — I10 Essential (primary) hypertension: Secondary | ICD-10-CM

## 2022-12-20 ENCOUNTER — Encounter: Payer: Self-pay | Admitting: Physician Assistant

## 2022-12-20 ENCOUNTER — Ambulatory Visit: Payer: Medicare HMO | Admitting: Physician Assistant

## 2022-12-20 DIAGNOSIS — M722 Plantar fascial fibromatosis: Secondary | ICD-10-CM

## 2022-12-20 MED ORDER — LIDOCAINE HCL 1 % IJ SOLN
1.0000 mL | INTRAMUSCULAR | Status: AC | PRN
  Administered 2022-12-20: 1 mL

## 2022-12-20 MED ORDER — METHYLPREDNISOLONE ACETATE 40 MG/ML IJ SUSP
40.0000 mg | INTRAMUSCULAR | Status: AC | PRN
  Administered 2022-12-20: 40 mg

## 2022-12-20 NOTE — Progress Notes (Signed)
Office Visit Note   Patient: Amanda Ponce           Date of Birth: 03-19-1936           MRN: [id number] Visit Date: 12/20/2022              Requested by: Teena Irani, PA-C [address] [place],  [place] [address] PCP: Teena Irani, PA-C  Chief Complaint  Patient presents with  . Right Foot - Pain      HPI: Amanda Ponce is a very pleasant 87 year old woman who is a patient of Dr. Eliberto Ivory.  She comes in today complaining of right heel pain.  She denies any injuries.  She said she had a similar problem in her left heel and Dr. Magnus Ivan told her she had plantar fasciitis and gave her an injection.  This gave her significant relief.  She does report a history of wearing unsupportive boots recently and the cold weather she thinks this may have contributed to the problem  Assessment & Plan: Visit Diagnoses:  1. Plantar fasciitis of right foot     Plan: X-rays did not demonstrate calcaneal spurring.  She is focally tender over the heel.  Consistent with plantar fasciitis.  Will go forward with an injection today.  I have also given her a Thera-Band and some plantar fascia stretching exercises may follow-up as needed  Follow-Up Instructions: Return if symptoms worsen or fail to improve.   Ortho Exam  Patient is alert, oriented, no adenopathy, well-dressed, normal affect, normal respiratory effort. Examination of her right heel she has a palpable pulse.  Foot is warm with good capillary refill no redness no erythema or signs of infection.  She does have focal tenderness in the plantar fascia in the heel.  She is neurovascular intact  Imaging: No results found. No images are attached to the encounter.  Labs: No results found for: "HGBA1C", "ESRSEDRATE", "CRP", "LABURIC", "REPTSTATUS", "GRAMSTAIN", "CULT", "LABORGA"   Lab Results  Component Value Date   ALBUMIN 4.3 02/17/2019    No results found for: "MG" No results found for: "VD25OH"  No results found for:  "PREALBUMIN"    Latest Ref Rng & Units 07/22/2016    1:58 PM 07/16/2016   12:00 AM 07/13/2016    4:58 AM  CBC EXTENDED  WBC 10^3/mL 7.0     7.4     12.4   RBC 3.87 - 5.11 MIL/uL   2.84   Hemoglobin 12.0 - 16.0 g/dL 9.4     8.7     9.4   HCT 36 - 46 % 29     26     27.7   Platelets 150 - 399 K/L 285     263     239   NEUT# /L 4           This result is from an external source.     There is no height or weight on file to calculate BMI.  Orders:  No orders of the defined types were placed in this encounter.  No orders of the defined types were placed in this encounter.    Procedures: Foot Inj  Date/Time: 12/20/2022 10:36 AM  Performed by: Yifan Auker, [place], [place] Authorized by: Hamed Debella, [place], [place]   Consent Given by:  Patient Site marked: the procedure site was marked   Timeout: prior to procedure the correct patient, procedure, and site was verified   Indications:  Fasciitis and pain Condition: Plantar Fasciitis  Location: right plantar fascia muscle   Prep: patient was prepped and draped in usual sterile fashion   Needle Size:  22 G and 25 G Medications:  1 mL lidocaine 1 %; 40 mg methylPREDNISolone acetate 40 MG/ML Patient Tolerance:  Patient tolerated the procedure well with no immediate complications  Clinical Data: No additional findings.  ROS:  All other systems negative, except as noted in the HPI. Review of Systems  Objective: Vital Signs: There were no vitals taken for this visit.  Specialty Comments:  No specialty comments available.  PMFS History: Patient Active Problem List   Diagnosis Date Noted  . Plantar fasciitis of right foot 12/20/2022  . Multinodular goiter 08/22/2020  . Aortic stenosis   . Osteoarthritis of right hip 07/11/2016  . Status post total replacement of right hip 07/11/2016  . Osteoarthritis of left hip 08/08/2015  . Status post total replacement of left hip 08/08/2015   Past Medical History:  Diagnosis Date  . Allergy    . Aortic stenosis   . Arthritis    OA - L hip  . Depression    situational- concerns about her weight and health  . Edema of both legs    ankles swell- pt attributes edema to her weight  . GERD (gastroesophageal reflux disease)   . Headache(784.0)    Sinsus  . Heart murmur   . History of blood transfusion    as a child, transfusion post tonsillectomy- also states that she has areas inher nose that has bleeding tendencies   . History of hiatal hernia   . Hypertension   . Seasonal allergies   . 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      Family History  Problem Relation Age of Onset  . Hypertension Mother   . Stroke Father 92       1st stroke @ 10Y, and then 2 more- age unknown  . Hyperlipidemia Father     Past Surgical History:  Procedure Laterality Date  . ABDOMINAL HYSTERECTOMY  2006  . APPENDECTOMY  1966  . BACK SURGERY    . CESAREAN SECTION     x 2  . Colonscopy    . EYE SURGERY     bil eyes  . Goiter    . Goiter  2008   Benign  . LUMBAR LAMINECTOMY/DECOMPRESSION MICRODISCECTOMY  08/19/2012   Procedure: LUMBAR LAMINECTOMY/DECOMPRESSION MICRODISCECTOMY 3 LEVELS;  Surgeon: Hosie Spangle, MD;  Location: [facility];  Service: Neurosurgery;  Laterality: N/A;  Lumbar two-three, Lumbar three-four,Lumbar four-five laminectomies  . TONSILLECTOMY  1948  . TOTAL HIP ARTHROPLASTY Left 08/08/2015   Procedure: LEFT TOTAL HIP ARTHROPLASTY ANTERIOR APPROACH;  Surgeon: Mcarthur Rossetti, MD;  Location: [facility];  Service: Orthopedics;  Laterality: Left;  . TOTAL HIP ARTHROPLASTY Right 07/11/2016   Procedure: RIGHT TOTAL HIP ARTHROPLASTY ANTERIOR APPROACH;  Surgeon: Mcarthur Rossetti, MD;  Location: [facility];  Service: Orthopedics;  Laterality: Right;   Social History   Occupational History  . Not on file  Tobacco Use  . Smoking status: Former    Packs/day: 1.00    Years: 56.00    Total pack years: 56.00    Types:  E-cigarettes, Cigarettes    Quit date: 11/25/2014    Years since quitting: 8.0  . Smokeless tobacco: Never  . Tobacco comments:    pt. states she "VAPES"- but my breathing has improved.   Vaping Use  .  Vaping Use: Never used  Substance and Sexual Activity  . Alcohol use: No  . Drug use: No  . Sexual activity: Not Currently

## 2022-12-23 ENCOUNTER — Encounter: Payer: Self-pay | Admitting: Orthopaedic Surgery

## 2023-01-08 ENCOUNTER — Ambulatory Visit: Payer: Medicare HMO | Admitting: Orthopaedic Surgery

## 2023-01-08 ENCOUNTER — Encounter: Payer: Self-pay | Admitting: Orthopaedic Surgery

## 2023-01-08 DIAGNOSIS — M65322 Trigger finger, left index finger: Secondary | ICD-10-CM | POA: Diagnosis not present

## 2023-01-08 MED ORDER — LIDOCAINE HCL 1 % IJ SOLN
0.5000 mL | INTRAMUSCULAR | Status: AC | PRN
  Administered 2023-01-08: .5 mL

## 2023-01-08 MED ORDER — METHYLPREDNISOLONE ACETATE 40 MG/ML IJ SUSP
40.0000 mg | INTRAMUSCULAR | Status: AC | PRN
  Administered 2023-01-08: 40 mg

## 2023-01-08 NOTE — Progress Notes (Signed)
The patient is an 87 year old female well-known to me.  We have seen her for trigger finger before of her left index finger.  It has been a long period of time since we have seen her and placed an injection in the A1 pulley area.  She is requesting 1 today.  She has been wearing a finger splint so her finger does not flex and get stuck.  She has had no other acute change in her medical status.  On examination there is pain over the index finger A1 pulley on the left side.  The remainder of her hand exam is normal.  I did place a steroid injection at the A1 pulley of her left index finger which she tolerated well.  Follow-up can be as needed.  All questions and concerns were answered and addressed.  If this does recur she could always consider surgery.     Procedure Note  Patient: Amanda Ponce             Date of Birth: 1936/06/13           MRN: [id number]             Visit Date: 01/08/2023  Procedures: Visit Diagnoses:  1. Trigger finger, left index finger     Hand/UE Inj: L index A1 for trigger finger on 01/08/2023 9:59 AM Medications: 0.5 mL lidocaine 1 %; 40 mg methylPREDNISolone acetate 40 MG/ML

## 2023-01-26 ENCOUNTER — Other Ambulatory Visit: Payer: Self-pay | Admitting: Cardiology

## 2023-01-26 DIAGNOSIS — I10 Essential (primary) hypertension: Secondary | ICD-10-CM

## 2023-04-08 ENCOUNTER — Other Ambulatory Visit: Payer: Self-pay

## 2023-04-08 DIAGNOSIS — I728 Aneurysm of other specified arteries: Secondary | ICD-10-CM

## 2023-04-22 ENCOUNTER — Inpatient Hospital Stay: Admission: RE | Admit: 2023-04-22 | Payer: Medicare HMO | Source: Ambulatory Visit

## 2023-04-23 ENCOUNTER — Ambulatory Visit: Payer: Medicare HMO | Admitting: Vascular Surgery

## 2023-04-25 ENCOUNTER — Ambulatory Visit: Payer: Medicare HMO | Admitting: Cardiology

## 2023-05-06 ENCOUNTER — Ambulatory Visit: Payer: Medicare HMO | Admitting: Cardiology

## 2023-05-06 ENCOUNTER — Encounter: Payer: Self-pay | Admitting: Cardiology

## 2023-05-06 VITALS — BP 127/49 | HR 81 | Resp 16 | Ht 64.0 in | Wt 212.0 lb

## 2023-05-06 DIAGNOSIS — I350 Nonrheumatic aortic (valve) stenosis: Secondary | ICD-10-CM

## 2023-05-06 DIAGNOSIS — I10 Essential (primary) hypertension: Secondary | ICD-10-CM

## 2023-05-06 DIAGNOSIS — E782 Mixed hyperlipidemia: Secondary | ICD-10-CM

## 2023-05-06 NOTE — Progress Notes (Signed)
Primary Physician/Referring:  Teena Irani, PA-C  Patient ID: Amanda Ponce, female    DOB: 01/24/1936, 87 y.o.   MRN: [id number]  Chief Complaint  Patient presents with   Nonrheumatic aortic valve stenosis   Follow-up    1 year    HPI:    Amanda Ponce  is a 87 y.o. female with mild hypertension, chronic tobacco use and now using Vape,   small 1.1 cm splenic artery aneurysm noted by ultrasound performed by [facility] in 2018 and confirmed by  CT angiogram of the abdomen in September 2022, mild COPD, coronary and aortic atherosclerosis by CT scan of the chest, mild carotid atherosclerosis and mild aortic stenosis and chronic dyspnea presents here for annual visit.  On her last office visit a year ago, I had reduced the dose of the lisinopril HCT due to orthostatic hypotension, since then she has not had any further episodes of dizziness or syncope or near syncope.  States that she is essentially asymptomatic and states that she is "living her life".  She worked on 85 in next 1 week.  He has remained stable.  No PND or orthopnea.  Leg edema is stable.  Past Medical History:  Diagnosis Date   Allergy    Aortic stenosis    Arthritis    OA - L hip   Depression    situational- concerns about her weight and health   Edema of both legs    ankles swell- pt attributes edema to her weight   GERD (gastroesophageal reflux disease)    Headache(784.0)    Sinsus   Heart murmur    History of blood transfusion    as a child, transfusion post tonsillectomy- also states that she has areas inher nose that has bleeding tendencies    History of hiatal hernia    Hypertension    Seasonal allergies    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     Social History   Tobacco Use   Smoking status: Former    Packs/day: 1.00    Years: 56.00    Additional pack years: 0.00    Total pack years: 56.00    Types: E-cigarettes, Cigarettes    Quit date: 11/25/2014     Years since quitting: 8.4   Smokeless tobacco: Never   Tobacco comments:    pt. states she "VAPES"- but my breathing has improved.   Substance Use Topics   Alcohol use: No   Marital Status: Divorced  ROS  Review of Systems  Cardiovascular:  Positive for dyspnea on exertion and leg swelling. Negative for chest pain.  Musculoskeletal:  Negative for joint pain.  Gastrointestinal:  Negative for melena.  Neurological:  Positive for dizziness.   Objective  Blood pressure (!) 127/49, pulse 81, resp. rate 16, height 5\' 4"  (1.626 m), weight 212 lb (96.2 kg), SpO2 94 %.     05/06/2023    4:05 PM 05/06/2023    3:59 PM 07/14/2022   12:30 PM  Vitals with BMI  Height  5\' 4"    Weight  212 lbs   BMI  36.37   Systolic 127 138 409  Diastolic 49 42 74  Pulse 81 81 82     Orthostatic VS for the past 72 hrs (Last 3 readings):  Patient Position BP Location Cuff Size  05/06/23 1605 Sitting Left Arm Large  05/06/23 1559 Sitting Left Arm Large  Physical Exam Constitutional:      Comments: Morderately Obese  Neck:     Vascular: No carotid bruit or JVD.  Cardiovascular:     Rate and Rhythm: Normal rate and regular rhythm.     Pulses: Intact distal pulses.          Carotid pulses are  on the right side with bruit and  on the left side with bruit.      Dorsalis pedis pulses are 2+ on the right side and 2+ on the left side.       Posterior tibial pulses are 2+ on the right side and 0 on the left side.     Heart sounds: Murmur heard.     Midsystolic murmur is present with a grade of 2/6 at the upper right sternal border radiating to the neck.     No gallop.  Pulmonary:     Effort: Pulmonary effort is normal. No accessory muscle usage or respiratory distress.     Breath sounds: Normal breath sounds.  Abdominal:     General: Bowel sounds are normal.     Palpations: Abdomen is soft.     Comments: obese  Musculoskeletal:     Right lower leg: Edema (1-2+ ankle) present.     Left lower leg: Edema  (1-2+ ankle) present.    Laboratory examination:   External labs:   Labs 02/11/2023:  TSH normal at 0.926.  A1c 5.7%.  Serum glucose 88 mg, BUN 19, creatinine 1.06, EGFR 50 mL, potassium 4.4.  Labs 11/12/2022:  Total cholesterol 160, triglycerides 139, HDL 41, LDL 94.  Labs 01/29/2022:  Hb 12.6/HCT 37.9, platelets 258, normal indicis.  Total cholesterol 192, triglycerides 184, HDL 38, LDL 121.  Non-HDL cholesterol 154.  Serum glucose 95 mg, BUN 21, creatinine 1.06, EGFR 52 mL, potassium 4.7, LFTs normal.  TSH normal at 1.210.   Radiology:   Low-dose CT scan of the chest 12/60/2018 comparison 06/09/2017: Cardiovascular: Advanced aortic and branch vessel atherosclerosis. Borderline cardiomegaly with lipomatous hypertrophy of the interatrial septum. Multivessel coronary artery atherosclerosis. No change in size of a simple appearing cystic lesion within the anterior mediastinum or associated with the pericardium. 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 Per consensus criteria, presuming the patient is asymptomatic, this requires no further imaging workup.  Cardiac Studies:   Treadmill exercise stress test 01/20/2019: Indication: Aorta stenosis The patient exercised on Bruce protocol for  03:05 min. Patient achieved  4.73 METS and reached HR  145 bpm, which is  103 % of maximum age-predicted HR.  Stress test terminated due to dyspnea. Resting EKG demonstrates Normal sinus rhythm. ST Changes: With peak exercise there was no ST-T changes of ischemia. Arrhythmias: Occasional PVC s. BP Response to Exercise: Normal resting BP- appropriate response. Exercise capacity was below average for age. Recommendations: Continue primary/secondary prevention. Consider further evaluation for aortic stenosis if clinically indicated.  Carotid artery duplex 06/13/2021:  Duplex suggests stenosis in the right internal carotid artery (1-15%). Duplex suggests stenosis in the right external carotid  artery (<50%).  Duplex suggests stenosis in the left internal carotid artery (1-15%). Duplex suggests stenosis in the left external carotid artery (<50%).  Antegrade right vertebral artery flow. Antegrade left vertebral artery flow.  External carotid artery stenosis probably source of bruit. Follow up in one year is appropriate if clinically indicated  PCV ECHOCARDIOGRAM COMPLETE 06/13/2021  Left ventricle cavity is normal in size. Moderate concentric hypertrophy of the left ventricle. Normal global wall motion. Normal LV systolic  function with visual EF 50-55%. Doppler evidence of grade I (impaired) diastolic dysfunction, normal LAP.  Trileaflet aortic valve. Mild aortic stenosis.  Vmax 2.4 m/sec, mean PG 13 mmHg, AVA 1.3 cm2 by continuity equation Trace aortic regurgitation. Mild (Grade I) mitral regurgitation. Mild tricuspid regurgitation. Estimated pulmonary artery systolic pressure 24 mmHg. No significant change compared to previous study on 12/25/2018.    EKG  EKG 05/06/2023: Normal sinus rhythm with rate of 79 bpm, normal axis, incomplete right bundle branch block.  Frequent PACs (3).  Compared to 04/24/2022, PACs new.  Medications and allergies   Allergies  Allergen Reactions   Statins     Other reaction(s): Other achy    Current Outpatient Medications:    budesonide-formoterol (SYMBICORT) 80-4.5 MCG/ACT inhaler, Inhale 2 puffs into the lungs 2 (two) times daily., Disp: , Rfl:    Evolocumab 140 MG/ML SOSY, Inject into the skin., Disp: , Rfl:    Fluocinolone Acetonide 0.01 % OIL, SMARTSIG:2-3 Drop(s) In Ear(s) Twice Daily PRN, Disp: , Rfl:    ketoconazole (NIZORAL) 2 % shampoo, Apply topically., Disp: , Rfl:    lisinopril-hydrochlorothiazide (ZESTORETIC) 10-12.5 MG tablet, TAKE 1 TABLET BY MOUTH EVERY MORNING, Disp: 90 tablet, Rfl: 1   Probiotic Product (PRO-BIOTIC BLEND PO), Take by mouth., Disp: , Rfl:   Assessment     ICD-10-CM   1. Nonrheumatic aortic valve stenosis  I35.0  EKG 12-Lead    PCV ECHOCARDIOGRAM COMPLETE    2. Essential hypertension  I10     3. Mixed hyperlipidemia  E78.2       No orders of the defined types were placed in this encounter.   Medications Discontinued During This Encounter  Medication Reason   methocarbamol (ROBAXIN) 500 MG tablet    glucosamine-chondroitin 500-400 MG tablet     Recommendations:   Amanda Ponce  is a 87 y.o.  female with mild hypertension, chronic tobacco use and now using Vape,   small 1.1 cm splenic artery aneurysm noted by ultrasound performed by [facility] in 2018 and confirmed by  CT angiogram of the abdomen in September 2022, mild COPD, coronary and aortic atherosclerosis by CT scan of the chest, mild carotid atherosclerosis and mild aortic stenosis and chronic dyspnea presents here for annual visit.  1. Nonrheumatic aortic valve stenosis Patient's physical examination is unchanged.  However I would like to repeat echocardiogram in a year prior to her next office visit.  She remains asymptomatic and although will be 87 next week is relatively busy and doing well and remains asymptomatic.  She has not had any further dizziness or syncope.  - EKG 12-Lead - PCV ECHOCARDIOGRAM COMPLETE; Future  2. Essential hypertension Blood pressure is well-controlled on present medical regimen with low-dose lisinopril HCT 10/12.5 mg with stable chronic bilateral leg edema and she is also wearing support stockings both for orthostatic hypotension and leg edema.  Since reducing the dose of the lisinopril HCT to minimal dose, presently doing well without recurrence of dizziness.  Continue the same.  I reviewed her renal function, stable.  3. Mixed hyperlipidemia Lipids are well-controlled, she is presently tolerating Repatha.  Continue the same.  External labs reviewed.  Overall stable from cardiac standpoint with no clinical evidence of heart failure, no significant dizziness or syncope, blood pressure is well-controlled, I will  see her back in a year with repeat echocardiogram.   Amanda Decamp, MD, [facility] 05/06/2023, 4:49 PM Office: [phone] Fax: [phone] Pager: [phone]

## 2023-06-03 ENCOUNTER — Other Ambulatory Visit: Payer: Medicare HMO

## 2023-06-04 ENCOUNTER — Ambulatory Visit: Payer: Medicare HMO | Admitting: Vascular Surgery

## 2023-07-21 ENCOUNTER — Ambulatory Visit: Payer: Medicare HMO | Admitting: Orthopaedic Surgery

## 2023-07-21 ENCOUNTER — Encounter: Payer: Self-pay | Admitting: Orthopaedic Surgery

## 2023-07-21 DIAGNOSIS — M65322 Trigger finger, left index finger: Secondary | ICD-10-CM

## 2023-07-21 MED ORDER — LIDOCAINE HCL 1 % IJ SOLN
1.0000 mL | INTRAMUSCULAR | Status: AC | PRN
Start: 2023-07-21 — End: 2023-07-21
  Administered 2023-07-21: 1 mL

## 2023-07-21 MED ORDER — METHYLPREDNISOLONE ACETATE 40 MG/ML IJ SUSP
40.0000 mg | INTRAMUSCULAR | Status: AC | PRN
Start: 2023-07-21 — End: 2023-07-21
  Administered 2023-07-21: 40 mg

## 2023-07-21 NOTE — Progress Notes (Signed)
The patient is an active 87 year old female well-known to me.  We have placed a steroid injection in her left index finger back in February of this year.  This was at the A1 pulley.  She is requesting another injection today for pain and triggering.  She is otherwise been doing well.  She is still not interested in any type of surgical intervention but if she does end up needing it, she would like to have this is an intraoffice procedure.  I would have our hand specialist Dr. Fara Boros see her then.  There is pain over the A1 pulley with active triggering of her left index finger.  She is otherwise neurovascularly intact with no other issues.  I did place a steroid injection around the A1 pulley of the left index finger that she tolerated well.  All questions and concerns were answered and addressed.  Follow-up is as needed.  If this does recur I would have her see Dr. Fara Boros.    Procedure Note  Patient: Amanda Ponce             Date of Birth: 08/31/1936           MRN: [id number]             Visit Date: 07/21/2023  Procedures: Visit Diagnoses:  1. Trigger finger, left index finger     Hand/UE Inj: L index A1 for trigger finger on 07/21/2023 2:49 PM Medications: 1 mL lidocaine 1 %; 40 mg methylPREDNISolone acetate 40 MG/ML

## 2023-07-29 ENCOUNTER — Ambulatory Visit: Payer: Medicare HMO | Admitting: Orthopaedic Surgery

## 2023-08-14 ENCOUNTER — Other Ambulatory Visit: Payer: Self-pay | Admitting: Cardiology

## 2023-08-14 DIAGNOSIS — I10 Essential (primary) hypertension: Secondary | ICD-10-CM

## 2023-09-05 ENCOUNTER — Other Ambulatory Visit (INDEPENDENT_AMBULATORY_CARE_PROVIDER_SITE_OTHER): Payer: Medicare HMO

## 2023-09-05 ENCOUNTER — Ambulatory Visit: Payer: Medicare HMO | Admitting: Orthopedic Surgery

## 2023-09-05 DIAGNOSIS — M1811 Unilateral primary osteoarthritis of first carpometacarpal joint, right hand: Secondary | ICD-10-CM | POA: Diagnosis not present

## 2023-09-05 DIAGNOSIS — M1812 Unilateral primary osteoarthritis of first carpometacarpal joint, left hand: Secondary | ICD-10-CM

## 2023-09-05 DIAGNOSIS — M79641 Pain in right hand: Secondary | ICD-10-CM

## 2023-09-05 DIAGNOSIS — M25532 Pain in left wrist: Secondary | ICD-10-CM | POA: Diagnosis not present

## 2023-09-05 MED ORDER — LIDOCAINE HCL 1 % IJ SOLN
1.0000 mL | INTRAMUSCULAR | Status: AC | PRN
Start: 2023-09-05 — End: 2023-09-05
  Administered 2023-09-05: 1 mL

## 2023-09-05 MED ORDER — BETAMETHASONE SOD PHOS & ACET 6 (3-3) MG/ML IJ SUSP
6.0000 mg | INTRAMUSCULAR | Status: AC | PRN
Start: 2023-09-05 — End: 2023-09-05
  Administered 2023-09-05: 6 mg via INTRA_ARTICULAR

## 2023-09-05 NOTE — Progress Notes (Addendum)
Amanda Ponce - 87 y.o. female MRN [id number]  Date of birth: [birth year]  Office Visit Note: Visit Date: 09/05/2023 PCP: Dani Gobble, PA-C Referred by: Dani Gobble, P*  Subjective: No chief complaint on file.  HPI: HIRA KNITTER is a pleasant 87 y.o. female who presents today for evaluation of bilateral hand complaints.  On her left side, she is describing pain at the basilar aspect of the left thumb does been present now for multiple months, worsening in nature.  She has trialed for Voltaren gel and over-the-counter nonsteroidal anti-inflammatories without lasting relief.  No prior injections or bracing.  She is also describing pain at the right thumb basilar aspect as well which is progressive.  This is also not responding to conservative treatments of topical or over-the-counter anti-inflammatory medication.  She describes mild numbness and tingling which is intermittent in nature, occasional nocturnal symptoms.  Pertinent ROS were reviewed with the patient and found to be negative unless otherwise specified above in HPI.   Visit Reason: bilateral hand L>R Duration of symptoms: 1+month Hand dominance: right Occupation: retired Diabetic: No Smoking: No Heart/Lung History:none Blood Thinners:  none  Prior Testing/EMG: none Injections (Date): 07/21/23 trigger left index Treatments: injection Prior Surgery:none  Assessment & Plan: Visit Diagnoses:  1. Pain in left wrist   2. Pain in right hand     Plan: Extensive discussion was had the patient today regarding her bilateral hand complaints.  I reviewed her radiographs from today which correlate with her clinical examination, she does have significant bilateral thumb CMC arthritic changes.  At this juncture, she is appropriate candidate for Comfort Cool bracing bilaterally.  We also discussed cortisone injections for symptom relief.  She is interested in having an injection for the left thumb basilar joint  today.  We did discuss surgical options in the form of [facility] arthroplasty as well as the postoperative treatment protocol in detail today.  Risks and benefit of the surgery were also discussed in detail.  We will proceed with cortisone injection to the left thumb [facility] articulation today for symptom relief.  Bilateral Comfort Cool braces given.  Follow-up in approximate 6 weeks for clinical recheck.  Follow-up: No follow-ups on file.   Meds & Orders: No orders of the defined types were placed in this encounter.   Orders Placed This Encounter  Procedures   Hand/UE Inj   XR Hand Complete Right   XR Wrist Complete Left     Procedures: Hand/UE Inj: L thumb CMC for osteoarthritis on 09/05/2023 1:54 PM Details: 25 G needle Medications: 1 mL lidocaine 1 %; 6 mg betamethasone acetate-betamethasone sodium phosphate 6 (3-3) MG/ML         Clinical History: No specialty comments available.  She reports that she quit smoking about 8 years ago. Her smoking use included e-cigarettes and cigarettes. She started smoking about 64 years ago. She has a 56 pack-year smoking history. She has never used smokeless tobacco. No results for input(s): "HGBA1C", "LABURIC" in the last 8760 hours.  Objective:   Vital Signs: There were no vitals taken for this visit.  Physical Exam  Gen: Well-appearing, in no acute distress; non-toxic CV: Regular Rate. Well-perfused. Warm.  Resp: Breathing unlabored on room air; no wheezing. Psych: Fluid speech in conversation; appropriate affect; normal thought process  Ortho Exam General: Patient is well appearing and in no distress. Cervical spine mobility is full in all directions:   Skin and Muscle: No skin changes are apparent to  upper extremities.  Muscle bulk and contour normal, no signs of atrophy.      Range of Motion and Palpation Tests: Mobility is full about the elbows with flexion and extension.  Forearm supination and pronation are 85/85 bilaterally.  Wrist  flexion/extension is 75/65 bilaterally.  Digital flexion and extension are full.  Thumb opposition is full to the base of the small fingers bilaterally.     No cords or nodules are palpated.  No triggering is observed.     Significant tenderness over the bilateral thumb CMC articulation is observed, positive grind, positive crepitus.  MP hyperextension present bilaterally, approximately 25 degrees.    Finklestein test positive bilaterally   Neurologic, Vascular, Motor: Sensation is intact to light touch in the median/radial/ulnar distributions.  Tinel's testing negative at wrist level. Phalen's mildly positive bilateral, Derkan's compression mildly positive bilaterally.  Fingers pink and well perfused.  Capillary refill is brisk.     Imaging: XR Wrist Complete Left  Result Date: 09/05/2023 X-rays of the left wrist, multiple views were obtained today X-rays demonstrate significant degenerative change at the thumb [facility] interval with osteophyte formation, subchondral sclerosis and joint space narrowing.  XR Hand Complete Right  Result Date: 09/05/2023 X-rays of the right hand, multiple views were obtained today including Betts view X-rays demonstrate significant degenerative change of the thumb [facility] articulation with joint space narrowing, osteophyte formation and subchondral sclerosis.  There is associated STT arthritis as well.  Diffuse small joint arthritis is appreciated at the DIP joints as well.   Past Medical/Family/Surgical/Social History: Medications & Allergies reviewed per EMR, new medications updated. Patient Active Problem List   Diagnosis Date Noted   Plantar fasciitis of right foot 12/20/2022   Seborrheic dermatitis 04/16/2021   Multinodular goiter 08/22/2020   Bilateral leg edema 04/26/2020   Severe obesity (BMI 35.0-39.9) with comorbidity (HCC) 03/27/2020   Aortic stenosis    Anxiety 11/17/2018   Splenic artery aneurysm (HCC) 11/19/2017   Osteoarthritis of right hip  07/11/2016   Status post total replacement of right hip 07/11/2016   Osteoarthritis of left hip 08/08/2015   Status post total replacement of left hip 08/08/2015   Presence of artificial hip joint 08/08/2015   Arthritis 08/04/2014   Diverticulosis 10/19/2013   Hyperlipidemia 07/21/2012   GERD (gastroesophageal reflux disease) 06/29/2012   HTN (hypertension) 04/07/2012   Low back pain 04/07/2012   Past Medical History:  Diagnosis Date   Allergy    Aortic stenosis    Arthritis    OA - L hip   Depression    situational- concerns about her weight and health   Edema of both legs    ankles swell- pt attributes edema to her weight   GERD (gastroesophageal reflux disease)    Headache(784.0)    Sinsus   Heart murmur    History of blood transfusion    as a child, transfusion post tonsillectomy- also states that she has areas inher nose that has bleeding tendencies    History of hiatal hernia    Hypertension    Seasonal allergies    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     Family History  Problem Relation Age of Onset   Hypertension Mother    Stroke Father 58       1st stroke @ 57Y, and then 2 more- age unknown   Hyperlipidemia Father    Past Surgical History:  Procedure Laterality Date  ABDOMINAL HYSTERECTOMY  2006   APPENDECTOMY  1966   BACK SURGERY     CESAREAN SECTION     x 2   Colonscopy     EYE SURGERY     bil eyes   Goiter     Goiter  2008   Benign   LUMBAR LAMINECTOMY/DECOMPRESSION MICRODISCECTOMY  08/19/2012   Procedure: LUMBAR LAMINECTOMY/DECOMPRESSION MICRODISCECTOMY 3 LEVELS;  Surgeon: Hewitt Shorts, MD;  Location: [facility];  Service: Neurosurgery;  Laterality: N/A;  Lumbar two-three, Lumbar three-four,Lumbar four-five laminectomies   TONSILLECTOMY  1948   TOTAL HIP ARTHROPLASTY Left 08/08/2015   Procedure: LEFT TOTAL HIP ARTHROPLASTY ANTERIOR APPROACH;  Surgeon: Kathryne Hitch, MD;  Location: [facility];   Service: Orthopedics;  Laterality: Left;   TOTAL HIP ARTHROPLASTY Right 07/11/2016   Procedure: RIGHT TOTAL HIP ARTHROPLASTY ANTERIOR APPROACH;  Surgeon: Kathryne Hitch, MD;  Location: [facility];  Service: Orthopedics;  Laterality: Right;   Social History   Occupational History   Not on file  Tobacco Use   Smoking status: Former    Current packs/day: 0.00    Average packs/day: 1 pack/day for 56.0 years (56.0 ttl pk-yrs)    Types: E-cigarettes, Cigarettes    Start date: 11/25/1958    Quit date: 11/25/2014    Years since quitting: 8.7   Smokeless tobacco: Never   Tobacco comments:    pt. states she "VAPES"- but my breathing has improved.   Vaping Use   Vaping status: Never Used  Substance and Sexual Activity   Alcohol use: No   Drug use: No   Sexual activity: Not Currently    Cattleya Dobratz Fara Boros) Denese Killings, M.D. [facility] 1:57 PM

## 2023-10-20 ENCOUNTER — Ambulatory Visit: Payer: Medicare HMO | Admitting: Orthopedic Surgery

## 2023-10-20 DIAGNOSIS — M1812 Unilateral primary osteoarthritis of first carpometacarpal joint, left hand: Secondary | ICD-10-CM | POA: Diagnosis not present

## 2023-10-20 NOTE — Progress Notes (Signed)
Amanda Ponce - 87 y.o. female MRN [id number]  Date of birth: [birth year]  Office Visit Note: Visit Date: 10/20/2023 PCP: Dani Gobble, PA-C Referred by: Dani Gobble, P*  Subjective: No chief complaint on file.  HPI: Amanda Ponce is a pleasant 87 y.o. female who presents today for follow-up of bilateral thumb CMC arthritis.  At her prior visit, she underwent cortisone injection to the left thumb [facility] articulation for symptom relief.  She states that the injection has helped her significantly with the pain.  She describes ongoing mild numbness and tingling which is intermittent in nature, occasional nocturnal symptoms.  Pertinent ROS were reviewed with the patient and found to be negative unless otherwise specified above in HPI.    Assessment & Plan: Visit Diagnoses:  No diagnosis found.   Plan: I am glad to see that the cortisone injection has helped her left thumb CMC arthritis to date.  We once again discussed the arthritic nature of her bilateral thumb CMC joints and the potential need for repeat injection versus potential surgical intervention in the future.  For the time being, we will continue with conservative modalities in the form of ongoing bracing and injections as needed.  She will return in approximately 6 weeks for repeat examination and discussion.  Follow-up: No follow-ups on file.   Meds & Orders: No orders of the defined types were placed in this encounter.   No orders of the defined types were placed in this encounter.    Procedures: No procedures performed      Clinical History: No specialty comments available.  She reports that she quit smoking about 8 years ago. Her smoking use included e-cigarettes and cigarettes. She started smoking about 64 years ago. She has a 56 pack-year smoking history. She has never used smokeless tobacco. No results for input(s): "HGBA1C", "LABURIC" in the last 8760 hours.  Objective:   Vital Signs: There  were no vitals taken for this visit.  Physical Exam  Gen: Well-appearing, in no acute distress; non-toxic CV: Regular Rate. Well-perfused. Warm.  Resp: Breathing unlabored on room air; no wheezing. Psych: Fluid speech in conversation; appropriate affect; normal thought process  Ortho Exam General: Patient is well appearing and in no distress. Cervical spine mobility is full in all directions:   Skin and Muscle: No skin changes are apparent to upper extremities.  Muscle bulk and contour normal, no signs of atrophy.      Range of Motion and Palpation Tests: Mobility is full about the elbows with flexion and extension.  Forearm supination and pronation are 85/85 bilaterally.  Wrist flexion/extension is 75/65 bilaterally.  Digital flexion and extension are full.  Thumb opposition is full to the base of the small fingers bilaterally.     No cords or nodules are palpated.  No triggering is observed.     Significant tenderness over the bilateral thumb CMC articulation is observed, positive grind, positive crepitus.  MP hyperextension present bilaterally, approximately 25 degrees.    Finklestein test positive bilaterally   Neurologic, Vascular, Motor: Sensation is intact to light touch in the median/radial/ulnar distributions.  Tinel's testing negative at wrist level. Phalen's mildly positive bilateral, Derkan's compression mildly positive bilaterally.  Fingers pink and well perfused.  Capillary refill is brisk.     Imaging: No results found.  Past Medical/Family/Surgical/Social History: Medications & Allergies reviewed per EMR, new medications updated. Patient Active Problem List   Diagnosis Date Noted   Plantar fasciitis of right foot  12/20/2022   Seborrheic dermatitis 04/16/2021   Multinodular goiter 08/22/2020   Bilateral leg edema 04/26/2020   Severe obesity (BMI 35.0-39.9) with comorbidity (HCC) 03/27/2020   Aortic stenosis    Anxiety 11/17/2018   Splenic artery aneurysm  (HCC) 11/19/2017   Osteoarthritis of right hip 07/11/2016   Status post total replacement of right hip 07/11/2016   Osteoarthritis of left hip 08/08/2015   Status post total replacement of left hip 08/08/2015   Presence of artificial hip joint 08/08/2015   Arthritis 08/04/2014   Diverticulosis 10/19/2013   Hyperlipidemia 07/21/2012   GERD (gastroesophageal reflux disease) 06/29/2012   HTN (hypertension) 04/07/2012   Low back pain 04/07/2012   Past Medical History:  Diagnosis Date   Allergy    Aortic stenosis    Arthritis    OA - L hip   Depression    situational- concerns about her weight and health   Edema of both legs    ankles swell- pt attributes edema to her weight   GERD (gastroesophageal reflux disease)    Headache(784.0)    Sinsus   Heart murmur    History of blood transfusion    as a child, transfusion post tonsillectomy- also states that she has areas inher nose that has bleeding tendencies    History of hiatal hernia    Hypertension    Seasonal allergies    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     Family History  Problem Relation Age of Onset   Hypertension Mother    Stroke Father 52       1st stroke @ 32Y, and then 2 more- age unknown   Hyperlipidemia Father    Past Surgical History:  Procedure Laterality Date   ABDOMINAL HYSTERECTOMY  2006   APPENDECTOMY  1966   BACK SURGERY     CESAREAN SECTION     x 2   Colonscopy     EYE SURGERY     bil eyes   Goiter     Goiter  2008   Benign   LUMBAR LAMINECTOMY/DECOMPRESSION MICRODISCECTOMY  08/19/2012   Procedure: LUMBAR LAMINECTOMY/DECOMPRESSION MICRODISCECTOMY 3 LEVELS;  Surgeon: Hewitt Shorts, MD;  Location: [facility];  Service: Neurosurgery;  Laterality: N/A;  Lumbar two-three, Lumbar three-four,Lumbar four-five laminectomies   TONSILLECTOMY  1948   TOTAL HIP ARTHROPLASTY Left 08/08/2015   Procedure: LEFT TOTAL HIP ARTHROPLASTY ANTERIOR APPROACH;  Surgeon:  Kathryne Hitch, MD;  Location: [facility];  Service: Orthopedics;  Laterality: Left;   TOTAL HIP ARTHROPLASTY Right 07/11/2016   Procedure: RIGHT TOTAL HIP ARTHROPLASTY ANTERIOR APPROACH;  Surgeon: Kathryne Hitch, MD;  Location: [facility];  Service: Orthopedics;  Laterality: Right;   Social History   Occupational History   Not on file  Tobacco Use   Smoking status: Former    Current packs/day: 0.00    Average packs/day: 1 pack/day for 56.0 years (56.0 ttl pk-yrs)    Types: E-cigarettes, Cigarettes    Start date: 11/25/1958    Quit date: 11/25/2014    Years since quitting: 8.9   Smokeless tobacco: Never   Tobacco comments:    pt. states she "VAPES"- but my breathing has improved.   Vaping Use   Vaping status: Never Used  Substance and Sexual Activity   Alcohol use: No   Drug use: No   Sexual activity: Not Currently    Sayf Kerner Fara Boros) Denese Killings, M.D. [facility] 6:40 PM

## 2023-11-12 ENCOUNTER — Ambulatory Visit: Payer: Medicare HMO | Admitting: Orthopedic Surgery

## 2023-11-12 DIAGNOSIS — M1812 Unilateral primary osteoarthritis of first carpometacarpal joint, left hand: Secondary | ICD-10-CM | POA: Diagnosis not present

## 2023-11-12 DIAGNOSIS — M1811 Unilateral primary osteoarthritis of first carpometacarpal joint, right hand: Secondary | ICD-10-CM

## 2023-11-12 DIAGNOSIS — M180 Bilateral primary osteoarthritis of first carpometacarpal joints: Secondary | ICD-10-CM

## 2023-11-12 MED ORDER — LIDOCAINE HCL 1 % IJ SOLN
1.0000 mL | INTRAMUSCULAR | Status: AC | PRN
  Administered 2023-11-12: 1 mL

## 2023-11-12 MED ORDER — BETAMETHASONE SOD PHOS & ACET 6 (3-3) MG/ML IJ SUSP
6.0000 mg | INTRAMUSCULAR | Status: AC | PRN
  Administered 2023-11-12: 6 mg via INTRA_ARTICULAR

## 2023-11-12 NOTE — Progress Notes (Signed)
Amanda Ponce - 87 y.o. female MRN [id number]  Date of birth: [birth year]  Office Visit Note: Visit Date: 11/12/2023 PCP: Dani Gobble, PA-C Referred by: Dani Gobble, P*  Subjective: No chief complaint on file.  HPI: Amanda Ponce is a pleasant 87 y.o. female who returns today for follow-up of bilateral thumb CMC arthritis.  Approximate 3 months prior, she underwent cortisone injection to the left thumb [facility] articulation for symptom relief.  At this juncture, her symptoms have returned to the left thumb CMC region, there are worsening on the right thumb CMC region.  Pertinent ROS were reviewed with the patient and found to be negative unless otherwise specified above in HPI.    Assessment & Plan: Visit Diagnoses:  No diagnosis found.   Plan:  We once again discussed the arthritic nature of her bilateral thumb CMC joints and the potential need for repeat injection versus potential surgical intervention in the future.  For the time being, we will continue with conservative modalities in the form of ongoing bracing and injections.  Bilateral thumb CMC injections were provided today.    We did review the potential surgical options in the future should her symptoms remain refractory to conservative care as well once again today, we discussed [facility] arthroplasty risks and benefits as well as the postoperative protocol.  She has a tentative plan to undergo left thumb CMC arthroplasty surgery in March of this year.  She will return in February for repeat examination and discussion.   Follow-up: No follow-ups on file.   Meds & Orders: No orders of the defined types were placed in this encounter.   No orders of the defined types were placed in this encounter.    Procedures: Hand/UE Inj: bilateral thumb CMC for osteoarthritis on 11/12/2023 11:47 AM Details: 25 G needle Medications (Right): 1 mL lidocaine 1 %; 6 mg betamethasone acetate-betamethasone sodium phosphate 6 (3-3)  MG/ML Medications (Left): 1 mL lidocaine 1 %; 6 mg betamethasone acetate-betamethasone sodium phosphate 6 (3-3) MG/ML Outcome: tolerated well, no immediate complications        Clinical History: No specialty comments available.  She reports that she quit smoking about 8 years ago. Her smoking use included e-cigarettes and cigarettes. She started smoking about 65 years ago. She has a 56 pack-year smoking history. She has never used smokeless tobacco. No results for input(s): "HGBA1C", "LABURIC" in the last 8760 hours.  Objective:   Vital Signs: There were no vitals taken for this visit.  Physical Exam  Gen: Well-appearing, in no acute distress; non-toxic CV: Regular Rate. Well-perfused. Warm.  Resp: Breathing unlabored on room air; no wheezing. Psych: Fluid speech in conversation; appropriate affect; normal thought process  Ortho Exam General: Patient is well appearing and in no distress. Cervical spine mobility is full in all directions:   Skin and Muscle: No skin changes are apparent to upper extremities.  Muscle bulk and contour normal, no signs of atrophy.      Range of Motion and Palpation Tests: Mobility is full about the elbows with flexion and extension.  Forearm supination and pronation are 85/85 bilaterally.  Wrist flexion/extension is 75/65 bilaterally.  Digital flexion and extension are full.  Thumb opposition is full to the base of the small fingers bilaterally.     No cords or nodules are palpated.  No triggering is observed.     Significant tenderness over the bilateral thumb CMC articulation is observed, positive grind, positive crepitus.  MP hyperextension present  bilaterally, approximately 25 degrees.    Finklestein test positive bilaterally   Neurologic, Vascular, Motor: Sensation is intact to light touch in the median/radial/ulnar distributions.  Tinel's testing negative at wrist level. Phalen's mildly positive bilateral, Derkan's compression mildly positive  bilaterally.  Fingers pink and well perfused.  Capillary refill is brisk.     Imaging: No results found.  Past Medical/Family/Surgical/Social History: Medications & Allergies reviewed per EMR, new medications updated. Patient Active Problem List   Diagnosis Date Noted  . Plantar fasciitis of right foot 12/20/2022  . Seborrheic dermatitis 04/16/2021  . Multinodular goiter 08/22/2020  . Bilateral leg edema 04/26/2020  . Severe obesity (BMI 35.0-39.9) with comorbidity (HCC) 03/27/2020  . Aortic stenosis   . Anxiety 11/17/2018  . Splenic artery aneurysm (HCC) 11/19/2017  . Osteoarthritis of right hip 07/11/2016  . Status post total replacement of right hip 07/11/2016  . Osteoarthritis of left hip 08/08/2015  . Status post total replacement of left hip 08/08/2015  . Presence of artificial hip joint 08/08/2015  . Arthritis 08/04/2014  . Diverticulosis 10/19/2013  . Hyperlipidemia 07/21/2012  . GERD (gastroesophageal reflux disease) 06/29/2012  . HTN (hypertension) 04/07/2012  . Low back pain 04/07/2012   Past Medical History:  Diagnosis Date  . Allergy   . Aortic stenosis   . Arthritis    OA - L hip  . Depression    situational- concerns about her weight and health  . Edema of both legs    ankles swell- pt attributes edema to her weight  . GERD (gastroesophageal reflux disease)   . Headache(784.0)    Sinsus  . Heart murmur   . History of blood transfusion    as a child, transfusion post tonsillectomy- also states that she has areas inher nose that has bleeding tendencies   . History of hiatal hernia   . Hypertension   . Seasonal allergies   . 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     Family History  Problem Relation Age of Onset  . Hypertension Mother   . Stroke Father 22       1st stroke @ 73Y, and then 2 more- age unknown  . Hyperlipidemia Father    Past Surgical History:  Procedure Laterality Date  . ABDOMINAL  HYSTERECTOMY  2006  . APPENDECTOMY  1966  . BACK SURGERY    . CESAREAN SECTION     x 2  . Colonscopy    . EYE SURGERY     bil eyes  . Goiter    . Goiter  2008   Benign  . LUMBAR LAMINECTOMY/DECOMPRESSION MICRODISCECTOMY  08/19/2012   Procedure: LUMBAR LAMINECTOMY/DECOMPRESSION MICRODISCECTOMY 3 LEVELS;  Surgeon: Hewitt Shorts, MD;  Location: [facility];  Service: Neurosurgery;  Laterality: N/A;  Lumbar two-three, Lumbar three-four,Lumbar four-five laminectomies  . TONSILLECTOMY  1948  . TOTAL HIP ARTHROPLASTY Left 08/08/2015   Procedure: LEFT TOTAL HIP ARTHROPLASTY ANTERIOR APPROACH;  Surgeon: Kathryne Hitch, MD;  Location: [facility] OR;  Service: Orthopedics;  Laterality: Left;  . TOTAL HIP ARTHROPLASTY Right 07/11/2016   Procedure: RIGHT TOTAL HIP ARTHROPLASTY ANTERIOR APPROACH;  Surgeon: Kathryne Hitch, MD;  Location: [facility]S;  Service: Orthopedics;  Laterality: Right;   Social History   Occupational History  . Not on file  Tobacco Use  . Smoking status: Former    Current packs/day: 0.00    Average packs/day: 1 pack/day for 56.0 years (56.0 ttl  pk-yrs)    Types: E-cigarettes, Cigarettes    Start date: 11/25/1958    Quit date: 11/25/2014    Years since quitting: 8.9  . Smokeless tobacco: Never  . Tobacco comments:    pt. states she "VAPES"- but my breathing has improved.   Vaping Use  . Vaping status: Never Used  Substance and Sexual Activity  . Alcohol use: No  . Drug use: No  . Sexual activity: Not Currently    Dany Harten Fara Boros) Denese Killings, M.D. [facility] 11:46 AM

## 2023-11-24 ENCOUNTER — Ambulatory Visit: Payer: Medicare HMO | Admitting: Orthopedic Surgery

## 2023-12-11 ENCOUNTER — Other Ambulatory Visit: Payer: Self-pay

## 2023-12-11 DIAGNOSIS — I728 Aneurysm of other specified arteries: Secondary | ICD-10-CM

## 2024-01-07 ENCOUNTER — Ambulatory Visit: Payer: Medicare HMO | Admitting: Vascular Surgery

## 2024-01-12 ENCOUNTER — Ambulatory Visit: Payer: Medicare HMO | Admitting: Orthopedic Surgery

## 2024-01-19 ENCOUNTER — Ambulatory Visit: Payer: Medicare HMO | Admitting: Orthopedic Surgery

## 2024-01-22 ENCOUNTER — Encounter: Payer: Self-pay | Admitting: Orthopedic Surgery

## 2024-02-06 ENCOUNTER — Ambulatory Visit: Admitting: Physician Assistant

## 2024-02-16 ENCOUNTER — Ambulatory Visit: Payer: Medicare HMO | Admitting: Orthopedic Surgery

## 2024-02-16 DIAGNOSIS — M1811 Unilateral primary osteoarthritis of first carpometacarpal joint, right hand: Secondary | ICD-10-CM

## 2024-02-16 DIAGNOSIS — M65322 Trigger finger, left index finger: Secondary | ICD-10-CM

## 2024-02-16 DIAGNOSIS — M180 Bilateral primary osteoarthritis of first carpometacarpal joints: Secondary | ICD-10-CM | POA: Diagnosis not present

## 2024-02-16 DIAGNOSIS — M1812 Unilateral primary osteoarthritis of first carpometacarpal joint, left hand: Secondary | ICD-10-CM

## 2024-02-16 MED ORDER — BETAMETHASONE SOD PHOS & ACET 6 (3-3) MG/ML IJ SUSP
6.0000 mg | INTRAMUSCULAR | Status: AC | PRN
  Administered 2024-02-16: 6 mg via INTRA_ARTICULAR

## 2024-02-16 MED ORDER — LIDOCAINE HCL 1 % IJ SOLN
1.0000 mL | INTRAMUSCULAR | Status: AC | PRN
  Administered 2024-02-16: 1 mL

## 2024-02-16 NOTE — Progress Notes (Signed)
 Amanda Ponce - 88 y.o. female MRN [id number]  Date of birth: [birth year]  Office Visit Note: Visit Date: 02/16/2024 PCP: Dani Gobble, PA-C Referred by: Dani Gobble, P*  Subjective: No chief complaint on file.  HPI: Amanda Ponce is a pleasant 88 y.o. female who returns today for follow-up of bilateral thumb CMC arthritis being managed conservatively with bracing and injections bilaterally.    Today, her major complaint is ongoing right thumb basilar joint pain as well as left index triggering.  The left index finger has undergone multiple injections in the past with symptoms refractory to conservative care.  Pertinent ROS were reviewed with the patient and found to be negative unless otherwise specified above in HPI.    Assessment & Plan: Visit Diagnoses:  1. Trigger finger, left index finger   2. Arthritis of carpometacarpal (CMC) joint of right thumb   3. Arthritis of carpometacarpal (CMC) joint of left thumb      Plan:  We once again discussed the arthritic nature of her bilateral thumb CMC joints and the potential need for repeat injection versus potential surgical intervention in the future.  For the time being, we will continue with conservative modalities in the form of ongoing bracing and injections.  Given that the right side is more symptomatic today, we can proceed for repeat injection to the right thumb [facility] articulation for symptom relief.  This will be her second injection in this area.  We did review the potential surgical options in the future should her symptoms remain refractory to conservative care as well once again today, we discussed [facility] arthroplasty risks and benefits as well as the postoperative protocol.    As for the left index trigger digit, this has become clearly refractory to prior conservative care with activity modification and previous injections x 3 by my partner Dr. Magnus Ivan.  At this juncture, I have recommended that we move  forward with left index finger trigger digit release under local anesthesia at the next available date per patient preference.  Risks and benefits of the procedure were discussed, risks including but not limited to infection, bleeding, scarring, stiffness, nerve injury, tendon injury, vascular injury, recurrence of symptoms and need for subsequent operation.  Patient expressed understanding.      Follow-up: No follow-ups on file.   Meds & Orders: No orders of the defined types were placed in this encounter.   No orders of the defined types were placed in this encounter.    Procedures: Hand/UE Inj: R thumb CMC for osteoarthritis on 02/16/2024 8:45 PM Details: 25 G needle Medications: 1 mL lidocaine 1 %; 6 mg betamethasone acetate-betamethasone sodium phosphate 6 (3-3) MG/ML Outcome: tolerated well, no immediate complications         Clinical History: No specialty comments available.  She reports that she quit smoking about 9 years ago. Her smoking use included e-cigarettes and cigarettes. She started smoking about 65 years ago. She has a 56 pack-year smoking history. She has never used smokeless tobacco. No results for input(s): "HGBA1C", "LABURIC" in the last 8760 hours.  Objective:   Vital Signs: There were no vitals taken for this visit.  Physical Exam  Gen: Well-appearing, in no acute distress; non-toxic CV: Regular Rate. Well-perfused. Warm.  Resp: Breathing unlabored on room air; no wheezing. Psych: Fluid speech in conversation; appropriate affect; normal thought process  Ortho Exam General: Patient is well appearing and in no distress. Cervical spine mobility is full in all  directions:   Skin and Muscle: No skin changes are apparent to upper extremities.  Muscle bulk and contour normal, no signs of atrophy.      Range of Motion and Palpation Tests: Mobility is full about the elbows with flexion and extension.  Forearm supination and pronation are 85/85 bilaterally.   Wrist flexion/extension is 75/65 bilaterally.  Digital flexion and extension are full.  Thumb opposition is full to the base of the small fingers bilaterally.     Palpable nodule left index finger A1 pulley with associated tenderness.  Notable clicking with deep flexion of the index finger as well.     Significant tenderness over the bilateral thumb CMC articulation is observed, positive grind, positive crepitus.  MP hyperextension present bilaterally, approximately 25 degrees.    Finklestein test positive bilaterally   Neurologic, Vascular, Motor: Sensation is intact to light touch in the median/radial/ulnar distributions.  Tinel's testing negative at wrist level. Phalen's mildly positive bilateral, Derkan's compression mildly positive bilaterally.  Fingers pink and well perfused.  Capillary refill is brisk.     Imaging: No results found.  Past Medical/Family/Surgical/Social History: Medications & Allergies reviewed per EMR, new medications updated. Patient Active Problem List   Diagnosis Date Noted   Plantar fasciitis of right foot 12/20/2022   Seborrheic dermatitis 04/16/2021   Multinodular goiter 08/22/2020   Bilateral leg edema 04/26/2020   Severe obesity (BMI 35.0-39.9) with comorbidity (HCC) 03/27/2020   Aortic stenosis    Anxiety 11/17/2018   Splenic artery aneurysm (HCC) 11/19/2017   Osteoarthritis of right hip 07/11/2016   Status post total replacement of right hip 07/11/2016   Osteoarthritis of left hip 08/08/2015   Status post total replacement of left hip 08/08/2015   Presence of artificial hip joint 08/08/2015   Arthritis 08/04/2014   Diverticulosis 10/19/2013   Hyperlipidemia 07/21/2012   GERD (gastroesophageal reflux disease) 06/29/2012   HTN (hypertension) 04/07/2012   Low back pain 04/07/2012   Past Medical History:  Diagnosis Date   Allergy    Aortic stenosis    Arthritis    OA - L hip   Depression    situational- concerns about her weight and health    Edema of both legs    ankles swell- pt attributes edema to her weight   GERD (gastroesophageal reflux disease)    Headache(784.0)    Sinsus   Heart murmur    History of blood transfusion    as a child, transfusion post tonsillectomy- also states that she has areas inher nose that has bleeding tendencies    History of hiatal hernia    Hypertension    Seasonal allergies    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     Family History  Problem Relation Age of Onset   Hypertension Mother    Stroke Father 64       1st stroke @ 32Y, and then 2 more- age unknown   Hyperlipidemia Father    Past Surgical History:  Procedure Laterality Date   ABDOMINAL HYSTERECTOMY  2006   APPENDECTOMY  1966   BACK SURGERY     CESAREAN SECTION     x 2   Colonscopy     EYE SURGERY     bil eyes   Goiter     Goiter  2008   Benign   LUMBAR LAMINECTOMY/DECOMPRESSION MICRODISCECTOMY  08/19/2012   Procedure: LUMBAR LAMINECTOMY/DECOMPRESSION MICRODISCECTOMY 3 LEVELS;  Surgeon: Hewitt Shorts,  MD;  Location: [facility];  Service: Neurosurgery;  Laterality: N/A;  Lumbar two-three, Lumbar three-four,Lumbar four-five laminectomies   TONSILLECTOMY  1948   TOTAL HIP ARTHROPLASTY Left 08/08/2015   Procedure: LEFT TOTAL HIP ARTHROPLASTY ANTERIOR APPROACH;  Surgeon: Kathryne Hitch, MD;  Location: [facility];  Service: Orthopedics;  Laterality: Left;   TOTAL HIP ARTHROPLASTY Right 07/11/2016   Procedure: RIGHT TOTAL HIP ARTHROPLASTY ANTERIOR APPROACH;  Surgeon: Kathryne Hitch, MD;  Location: [facility];  Service: Orthopedics;  Laterality: Right;   Social History   Occupational History   Not on file  Tobacco Use   Smoking status: Former    Current packs/day: 0.00    Average packs/day: 1 pack/day for 56.0 years (56.0 ttl pk-yrs)    Types: E-cigarettes, Cigarettes    Start date: 11/25/1958    Quit date: 11/25/2014    Years since quitting: 9.2   Smokeless tobacco:  Never   Tobacco comments:    pt. states she "VAPES"- but my breathing has improved.   Vaping Use   Vaping status: Never Used  Substance and Sexual Activity   Alcohol use: No   Drug use: No   Sexual activity: Not Currently    Imari Sivertsen Fara Boros) Denese Killings, M.D. [facility]

## 2024-03-04 ENCOUNTER — Other Ambulatory Visit: Payer: Self-pay | Admitting: Cardiology

## 2024-03-04 DIAGNOSIS — I10 Essential (primary) hypertension: Secondary | ICD-10-CM

## 2024-03-15 ENCOUNTER — Encounter: Admitting: Orthopedic Surgery

## 2024-04-12 ENCOUNTER — Encounter: Payer: Self-pay | Admitting: Cardiology

## 2024-04-12 NOTE — Telephone Encounter (Signed)
 Amanda Ponce, [name] to Me (Selected Message) KG   04/12/24  4:04 PM The estimated price of an echo using procedure code 16109 is $845.00

## 2024-04-28 ENCOUNTER — Ambulatory Visit: Payer: Self-pay | Admitting: Cardiology

## 2024-04-28 ENCOUNTER — Ambulatory Visit (HOSPITAL_COMMUNITY)
Admission: RE | Admit: 2024-04-28 | Discharge: 2024-04-28 | Disposition: A | Discharge status: Home / Self Care | Payer: Medicare HMO | Source: Ambulatory Visit | Attending: Cardiology | Admitting: Cardiology

## 2024-04-28 ENCOUNTER — Other Ambulatory Visit: Payer: Medicare HMO

## 2024-04-28 DIAGNOSIS — I350 Nonrheumatic aortic (valve) stenosis: Secondary | ICD-10-CM | POA: Insufficient documentation

## 2024-04-28 LAB — ECHOCARDIOGRAM COMPLETE
AR max vel: 1.7 cm2
AV Area VTI: 1.63 cm2
AV Area mean vel: 1.72 cm2
AV Mean grad: 13 mmHg
AV Peak grad: 22.9 mmHg
Ao pk vel: 2.39 m/s
Area-P 1/2: 3.28 cm2
MV VTI: 1.69 cm2
S' Lateral: 2.9 cm

## 2024-04-28 NOTE — Progress Notes (Signed)
 Echocardiogram reveals normal LV EF, very mild aortic stenosis, continue monitoring.

## 2024-05-05 ENCOUNTER — Ambulatory Visit: Payer: Medicare HMO | Admitting: Cardiology

## 2024-06-16 ENCOUNTER — Encounter: Payer: Self-pay | Admitting: Cardiology

## 2024-06-16 ENCOUNTER — Ambulatory Visit: Attending: Cardiology | Admitting: Cardiology

## 2024-06-16 VITALS — BP 119/70 | HR 89 | Resp 14 | Ht 64.0 in | Wt 210.8 lb

## 2024-06-16 DIAGNOSIS — I10 Essential (primary) hypertension: Secondary | ICD-10-CM

## 2024-06-16 DIAGNOSIS — I350 Nonrheumatic aortic (valve) stenosis: Secondary | ICD-10-CM | POA: Diagnosis not present

## 2024-06-16 DIAGNOSIS — R0989 Other specified symptoms and signs involving the circulatory and respiratory systems: Secondary | ICD-10-CM | POA: Diagnosis not present

## 2024-06-16 DIAGNOSIS — E782 Mixed hyperlipidemia: Secondary | ICD-10-CM | POA: Diagnosis not present

## 2024-06-16 NOTE — Patient Instructions (Signed)

## 2024-06-16 NOTE — Progress Notes (Signed)
 Cardiology Office Note:  .   Date:  06/16/2024  ID:  Amanda Ponce, DOB [birth year], MRN [id number] PCP: Jacques Camie Pepper, PA-C  [facility] Providers Cardiologist:  Gordy Bergamo, MD   History of Present Illness: .   Amanda Ponce is a 88 y.o.  female with mild hypertension, chronic tobacco use and now using Vape, small 1.1 cm splenic artery aneurysm noted by ultrasound performed by [facility] in 2018 and confirmed by  CT angiogram of the abdomen in September 2022, mild COPD, coronary and aortic atherosclerosis by CT scan of the chest, mild carotid atherosclerosis and mild aortic stenosis and normal LVEF by echocardiogram on 06/13/2021 and chronic dyspnea presents here for annual visit and underwent echocardiogram on 04/28/2024 revealing no significant change in aortic stenosis severity and normal LVEF.   Discussed the use of AI scribe software for clinical note transcription with the patient, who gave verbal consent to proceed.  History of Present Illness ANELISSE Ponce is an 88 year old female with aortic stenosis and hypertension who presents for a cardiovascular follow-up.  She has aortic stenosis with no symptoms such as shortness of breath or syncope. Echocardiograms have been performed from 2022 to 2026. Her hypertension is well-controlled with lisinopril  HCT 10/12.5 mg once daily, with a recent blood pressure reading of 119/70 mmHg. She uses Repatha injections for hyperlipidemia, maintaining cholesterol levels at goal. She wears compression socks for venous insufficiency, which cause discomfort if not put on immediately upon waking. She denies consuming salty foods. She does not smoke but occasionally vapes without nicotine.  Labs   External Labs:  Care everywhere labs 09/03/2023:  BUN 19, creatinine 1.06, EGFR 51 mL, potassium 5.0, LFTs normal.  A1c 5%.  TSH normal at 1.330.  Total cholesterol 101, triglycerides 152, HDL 47, LDL 29.  ROS  Review of Systems  Cardiovascular:   Negative for chest pain, dyspnea on exertion and leg swelling.   Physical Exam:   VS:  BP 119/70 (BP Location: Left Arm, Patient Position: Sitting, Cuff Size: Large)   Pulse 89   Resp 14   Ht 5' 4 (1.626 m)   Wt 210 lb 12.8 oz (95.6 kg)   SpO2 94%   BMI 36.18 kg/m    Wt Readings from Last 3 Encounters:  06/16/24 210 lb 12.8 oz (95.6 kg)  05/06/23 212 lb (96.2 kg)  04/24/22 207 lb 6.4 oz (94.1 kg)    Physical Exam Neck:     Vascular: Carotid bruit (bilateral carotid bruit) present. No JVD.  Cardiovascular:     Rate and Rhythm: Normal rate and regular rhythm.     Pulses: Intact distal pulses.     Heart sounds: S1 normal and S2 normal. Murmur heard.     Early systolic murmur is present with a grade of 2/6 at the upper right sternal border radiating to the neck.     No gallop.  Pulmonary:     Effort: Pulmonary effort is normal.     Breath sounds: Normal breath sounds.  Abdominal:     General: Bowel sounds are normal.     Palpations: Abdomen is soft.  Musculoskeletal:     Right lower leg: No edema.     Left lower leg: No edema.    Studies Reviewed: .    Carotid artery duplex 06/13/2021:  Duplex suggests stenosis in the right internal carotid artery (1-15%).  Duplex suggests stenosis in the right external carotid artery (<50%).  Duplex suggests stenosis in the  left internal carotid artery (1-15%).  Duplex suggests stenosis in the left external carotid artery (<50%).  Antegrade right vertebral artery flow. Antegrade left vertebral artery  flow.  External carotid artery stenosis probably source of bruit.   ECHOCARDIOGRAM COMPLETE 04/28/2024  1. Left ventricular ejection fraction, by estimation, is 60 to 65%. Left ventricular ejection fraction by 3D volume is 68 %. The left ventricle has normal function. The left ventricle has no regional wall motion abnormalities. There is mild concentric left ventricular hypertrophy. Left ventricular diastolic parameters are consistent with  Grade I diastolic dysfunction (impaired relaxation). 2. Right ventricular systolic function is normal. The right ventricular size is normal. Mildly increased right ventricular wall thickness. Tricuspid regurgitation signal is inadequate for assessing PA pressure. 3. The mitral valve is normal in structure. Mild mitral valve regurgitation. No evidence of mitral stenosis. 4. The aortic valve is tricuspid. There is moderate calcification of the aortic valve. Aortic valve regurgitation is not visualized. Mild aortic valve stenosis. Aortic valve area, by VTI measures 1.63 cm. Aortic valve mean gradient measures 13.0 mmHg. 5. The inferior vena cava is normal in size with greater than 50% respiratory variability, suggesting right atrial pressure of 3 mmHg.  EKG:    EKG Interpretation Date/Time:  Wednesday June 16 2024 11:58:03 EDT Ventricular Rate:  83 PR Interval:  190 QRS Duration:  70 QT Interval:  362 QTC Calculation: 425 R Axis:   24  Text Interpretation: EKG 06/16/2024: Normal sinus rhythm with rate of 83 bpm, normal EKG.  Compared to 07/27/2015, no significant change. Confirmed by Marea Reasner, Jagadeesh ([id number]) on 06/16/2024 12:14:39 PM    Medications ordered    No orders of the defined types were placed in this encounter.    ASSESSMENT AND PLAN: .      ICD-10-CM   1. Mild aortic stenosis  I35.0 EKG 12-Lead    2. Essential hypertension  I10     3. Mixed hyperlipidemia  E78.2       Assessment & Plan Aortic stenosis, mild Mild aortic stenosis with no change in echocardiogram findings since 2022. No symptoms such as worsening dyspnea or syncope. - No further evaluation unless symptoms such as worsening dyspnea, syncope, or significant change in murmur occur.  Carotid artery stenosis, mild Mild external (not ICA) carotid artery stenosis with bruit.  - No immediate intervention required. - No ICA stenosis. BP and lipids well controlled  Venous insufficiency Chronic venous  insufficiency with leg swelling managed by compression stockings. Discomfort if stockings are not applied immediately upon waking. - Advise wearing compression stockings within 30 minutes of waking to reduce swelling and discomfort. - If stockings are not worn immediately upon waking, recommend elevating legs for 30-40 minutes before application. - Advise reducing salt intake to minimize swelling.  Hypertension, well controlled Hypertension well controlled with current medication regimen. Blood pressure is 119/70 mmHg. - Continue Lisinopril  HCT 10/12.5 mg once daily.  Hyperlipidemia, well controlled Hyperlipidemia well controlled with Repatha injections. Lipid levels are at goal. - Continue Repatha injections as prescribed. I will see her on a PRN basis  Signed,  Gordy Bergamo, MD, [facility] 06/16/2024, 12:22 PM [facility] [address] [place], [place] [address] Phone: [phone]. Fax:  [phone]

## 2024-10-04 ENCOUNTER — Encounter: Payer: Self-pay | Admitting: Radiology

## 2024-12-13 ENCOUNTER — Ambulatory Visit: Admitting: Orthopedic Surgery

## 2024-12-13 DIAGNOSIS — G5602 Carpal tunnel syndrome, left upper limb: Secondary | ICD-10-CM | POA: Diagnosis not present

## 2024-12-13 MED ORDER — LIDOCAINE HCL 1 % IJ SOLN
1.0000 mL | INTRAMUSCULAR | Status: AC | PRN
  Administered 2024-12-13: 1 mL

## 2024-12-13 MED ORDER — BETAMETHASONE SOD PHOS & ACET 6 (3-3) MG/ML IJ SUSP
6.0000 mg | INTRAMUSCULAR | Status: AC | PRN
  Administered 2024-12-13: 6 mg via INTRA_ARTICULAR

## 2024-12-13 NOTE — Progress Notes (Signed)
 "     Amanda Ponce - 89 y.o. female MRN [id number]  Date of birth: 04-25-1936  Office Visit Note: Visit Date: 12/13/2024 PCP: Jacques Camie Pepper, PA-C Referred by: Jacques Camie Pepper, PA-C  Subjective: No chief complaint on file.  HPI: Amanda Ponce is a pleasant 89 y.o. female previously known to me for bilateral thumb CMC arthritis being managed conservatively with bracing and injections bilaterally as well as a left index trigger digit that has undergone multiple injections in the past.  Today, her major complaint is ongoing numbness and tingling in the left hand radial sided digits with associated nocturnal symptoms.  She is describing pain and burning throughout the hand that has been refractory to bracing, positioning and activity modification.  Pertinent ROS were reviewed with the patient and found to be negative unless otherwise specified above in HPI.    Assessment & Plan: Visit Diagnoses:  1. Carpal tunnel syndrome, left upper limb       Plan:  Extensive discussion was had with the patient today about their ongoing left hand complaints.  This appears to be compatible with left sided carpal tunnel syndrome from a clinical standpoint.  We discussed the underlying etiology and pathophysiology of carpal tunnel syndrome.  We discussed treatment modalities ranging from conservative to surgical.  From a conservative standpoint, we discussed ongoing activity modification, bracing, nonsteroidal anti-inflammatory medication, therapeutic exercise and stretching and possible cortisone injections.  We discussed anti-inflammatory medication both topical and oral.  We did also discuss the possibility of carpal tunnel release both open and endoscopic procedures, as well as appropriate postoperative regimen should symptoms remain refractory to conservative care.   Given that she has trialed bracing and activity modification, we will perform cortisone injection today to the left carpal  tunnel.  Risks and benefits of the injection were discussed in detail and patient elected to proceed.  Injection was performed under ultrasound guidance.  She will return in 6 weeks to track progress after the injection.  She is encouraged to continue utilizing the wrist brace, particularly for nocturnal symptoms.  I spent 30 minutes in the care of this patient today including review of previous documentation, imaging obtained, face-to-face time discussing all options regarding treatment and documenting the encounter.     Follow-up: No follow-ups on file.   Meds & Orders: No orders of the defined types were placed in this encounter.   Orders Placed This Encounter  Procedures   Hand/UE Inj     Procedures: Hand/UE Inj: L carpal tunnel for carpal tunnel syndrome on 12/13/2024 3:10 PM Indications: diagnostic and therapeutic Details: 25 G needle, ultrasound-guided Medications: 1 mL lidocaine  1 %; 6 mg betamethasone  acetate-betamethasone  sodium phosphate  6 (3-3) MG/ML Outcome: tolerated well, no immediate complications  Images saved in the [facility] ultrasound database         Clinical History: No specialty comments available.  She reports that she quit smoking about 10 years ago. Her smoking use included e-cigarettes and cigarettes. She started smoking about 66 years ago. She has a 56 pack-year smoking history. She has never used smokeless tobacco. No results for input(s): HGBA1C, LABURIC in the last 8760 hours.  Objective:   Vital Signs: There were no vitals taken for this visit.  Physical Exam  Gen: Well-appearing, in no acute distress; non-toxic CV: Regular Rate. Well-perfused. Warm.  Resp: Breathing unlabored on room air; no wheezing. Psych: Fluid speech in conversation; appropriate affect; normal thought process  Ortho Exam General: Patient is well appearing  and in no distress. Cervical spine mobility is full in all directions:   Skin and Muscle: No skin changes are  apparent to upper extremities.  Muscle bulk and contour normal, no signs of atrophy.      Range of Motion and Palpation Tests: Mobility is full about the elbows with flexion and extension.  Forearm supination and pronation are 85/85 bilaterally.  Wrist flexion/extension is 75/65 bilaterally.  Digital flexion and extension are full.  Thumb opposition is full to the base of the small fingers bilaterally.     Significant tenderness over the bilateral thumb CMC articulation is observed, positive grind, positive crepitus.  MP hyperextension present bilaterally, approximately 25 degrees.       Neurologic, Vascular, Motor: Sensation is diminished to light touch in the left median nerve distribution.  Tinel's testing positive at wrist level. Phalen's positive bilateral, Derkan's compression positive left  Fingers pink and well perfused.  Capillary refill is brisk.     Imaging: No results found.  Past Medical/Family/Surgical/Social History: Medications & Allergies reviewed per EMR, new medications updated. Patient Active Problem List   Diagnosis Date Noted   Plantar fasciitis of right foot 12/20/2022   Seborrheic dermatitis 04/16/2021   Multinodular goiter 08/22/2020   Bilateral leg edema 04/26/2020   Severe obesity (BMI 35.0-39.9) with comorbidity (HCC) 03/27/2020   Aortic stenosis    Anxiety 11/17/2018   Splenic artery aneurysm 11/19/2017   Osteoarthritis of right hip 07/11/2016   Status post total replacement of right hip 07/11/2016   Osteoarthritis of left hip 08/08/2015   Status post total replacement of left hip 08/08/2015   Presence of artificial hip joint 08/08/2015   Arthritis 08/04/2014   Diverticulosis 10/19/2013   Hyperlipidemia 07/21/2012   GERD (gastroesophageal reflux disease) 06/29/2012   HTN (hypertension) 04/07/2012   Low back pain 04/07/2012   Past Medical History:  Diagnosis Date   Allergy    Aortic stenosis    Arthritis    OA - L hip   Depression     situational- concerns about her weight and health   Edema of both legs    ankles swell- pt attributes edema to her weight   GERD (gastroesophageal reflux disease)    Headache(784.0)    Sinsus   Heart murmur    History of blood transfusion    as a child, transfusion post tonsillectomy- also states that she has areas inher nose that has bleeding tendencies    History of hiatal hernia    Hypertension    Seasonal allergies    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     Family History  Problem Relation Age of Onset   Hypertension Mother    Stroke Father 39       1st stroke @ 62Y, and then 2 more- age unknown   Hyperlipidemia Father    Past Surgical History:  Procedure Laterality Date   ABDOMINAL HYSTERECTOMY  2006   APPENDECTOMY  1966   BACK SURGERY     CESAREAN SECTION     x 2   Colonscopy     EYE SURGERY     bil eyes   Goiter     Goiter  2008   Benign   LUMBAR LAMINECTOMY/DECOMPRESSION MICRODISCECTOMY  08/19/2012   Procedure: LUMBAR LAMINECTOMY/DECOMPRESSION MICRODISCECTOMY 3 LEVELS;  Surgeon: Lamar LELON Peaches, MD;  Location: [facility];  Service: Neurosurgery;  Laterality: N/A;  Lumbar two-three, Lumbar three-four,Lumbar four-five laminectomies  TONSILLECTOMY  1948   TOTAL HIP ARTHROPLASTY Left 08/08/2015   Procedure: LEFT TOTAL HIP ARTHROPLASTY ANTERIOR APPROACH;  Surgeon: Lonni CINDERELLA Poli, MD;  Location: [facility];  Service: Orthopedics;  Laterality: Left;   TOTAL HIP ARTHROPLASTY Right 07/11/2016   Procedure: RIGHT TOTAL HIP ARTHROPLASTY ANTERIOR APPROACH;  Surgeon: Lonni CINDERELLA Poli, MD;  Location: [facility];  Service: Orthopedics;  Laterality: Right;   Social History   Occupational History   Not on file  Tobacco Use   Smoking status: Former    Current packs/day: 0.00    Average packs/day: 1 pack/day for 56.0 years (56.0 ttl pk-yrs)    Types: E-cigarettes, Cigarettes    Start date: 11/25/1958    Quit date: 11/25/2014     Years since quitting: 10.0   Smokeless tobacco: Never   Tobacco comments:    pt. states she VAPES- but my breathing has improved.   Vaping Use   Vaping status: Never Used  Substance and Sexual Activity   Alcohol use: No   Drug use: No   Sexual activity: Not Currently    Folashade Gamboa Estela) Arlinda, M.D. [facility]   "

## 2025-01-03 ENCOUNTER — Ambulatory Visit: Admitting: Orthopedic Surgery

## 2025-01-24 ENCOUNTER — Ambulatory Visit: Admitting: Orthopedic Surgery
